# Patient Record
Sex: Female | Born: 1950
Health system: Southern US, Community
[De-identification: ages and names within clinical notes are randomized; demographics above are authoritative.]

## PROBLEM LIST (undated history)

## (undated) DIAGNOSIS — E785 Hyperlipidemia, unspecified: Secondary | ICD-10-CM

## (undated) DIAGNOSIS — B029 Zoster without complications: Secondary | ICD-10-CM

## (undated) DIAGNOSIS — F419 Anxiety disorder, unspecified: Secondary | ICD-10-CM

## (undated) DIAGNOSIS — K219 Gastro-esophageal reflux disease without esophagitis: Secondary | ICD-10-CM

## (undated) DIAGNOSIS — I10 Essential (primary) hypertension: Secondary | ICD-10-CM

## (undated) HISTORY — DX: Gastro-esophageal reflux disease without esophagitis: K21.9

## (undated) HISTORY — DX: Anxiety disorder, unspecified: F41.9

## (undated) HISTORY — DX: Hyperlipidemia, unspecified: E78.5

## (undated) HISTORY — PX: OTHER SURGICAL HISTORY: SHX169

## (undated) HISTORY — DX: Essential (primary) hypertension: I10

## (undated) HISTORY — PX: ABDOMINAL HYSTERECTOMY: SHX81

## (undated) HISTORY — DX: Zoster without complications: B02.9

---

## 2000-12-30 ENCOUNTER — Inpatient Hospital Stay (HOSPITAL_COMMUNITY): Admission: EM | Admit: 2000-12-30 | Discharge: 2001-01-02 | Payer: Self-pay | Admitting: Internal Medicine

## 2001-04-24 ENCOUNTER — Ambulatory Visit (HOSPITAL_BASED_OUTPATIENT_CLINIC_OR_DEPARTMENT_OTHER): Admission: RE | Admit: 2001-04-24 | Discharge: 2001-04-24 | Payer: Self-pay | Admitting: Orthopedic Surgery

## 2004-08-27 ENCOUNTER — Encounter: Admission: RE | Admit: 2004-08-27 | Discharge: 2004-08-27 | Payer: Self-pay | Admitting: *Deleted

## 2006-05-02 LAB — HM COLONOSCOPY

## 2012-10-25 LAB — LIPID PANEL: HDL: 52 mg/dL (ref 35–70)

## 2012-10-25 LAB — HEPATIC FUNCTION PANEL
ALT: 21 U/L (ref 7–35)
AST: 14 U/L (ref 13–35)
Bilirubin, Total: 0.5 mg/dL

## 2012-10-25 LAB — BASIC METABOLIC PANEL: Potassium: 4 mmol/L (ref 3.4–5.3)

## 2013-02-25 ENCOUNTER — Other Ambulatory Visit: Payer: Self-pay | Admitting: Family Medicine

## 2013-02-25 DIAGNOSIS — Z780 Asymptomatic menopausal state: Secondary | ICD-10-CM

## 2013-02-27 ENCOUNTER — Encounter: Payer: Self-pay | Admitting: *Deleted

## 2013-03-05 ENCOUNTER — Other Ambulatory Visit: Payer: Self-pay

## 2013-03-05 ENCOUNTER — Ambulatory Visit: Payer: 59

## 2013-03-05 ENCOUNTER — Ambulatory Visit (INDEPENDENT_AMBULATORY_CARE_PROVIDER_SITE_OTHER): Payer: 59 | Admitting: Pharmacist

## 2013-03-05 VITALS — BP 144/80 | HR 70 | Ht 65.5 in | Wt 159.5 lb

## 2013-03-05 DIAGNOSIS — E785 Hyperlipidemia, unspecified: Secondary | ICD-10-CM

## 2013-03-05 DIAGNOSIS — Z780 Asymptomatic menopausal state: Secondary | ICD-10-CM

## 2013-03-05 DIAGNOSIS — Z139 Encounter for screening, unspecified: Secondary | ICD-10-CM

## 2013-03-05 DIAGNOSIS — I10 Essential (primary) hypertension: Secondary | ICD-10-CM

## 2013-03-05 LAB — COMPLETE METABOLIC PANEL WITH GFR
ALT: 17 U/L (ref 0–35)
BUN: 18 mg/dL (ref 6–23)
CO2: 27 mEq/L (ref 19–32)
Chloride: 106 mEq/L (ref 96–112)
Creat: 0.83 mg/dL (ref 0.50–1.10)
GFR, Est African American: 87 mL/min
GFR, Est Non African American: 75 mL/min
Total Bilirubin: 0.5 mg/dL (ref 0.3–1.2)
Total Protein: 6.8 g/dL (ref 6.0–8.3)

## 2013-03-05 LAB — POCT CBC
Granulocyte percent: 59.5 %G (ref 37–80)
HCT, POC: 40.8 % (ref 37.7–47.9)
Lymph, poc: 2.7 (ref 0.6–3.4)
RBC: 4.8 M/uL (ref 4.04–5.48)
RDW, POC: 14.4 %

## 2013-03-05 NOTE — Progress Notes (Signed)
°  [facility] Current Height: Height: 5' 5.5" (166.4 cm)      Max Lifetime Height:  5' 5.5" Current Weight: Weight: 159 lb 8 oz (72.349 kg)    Ethnicity: Not Hispanic or Latino [1] BP:       HR:         HPI: Does pt already have a diagnosis of:  Osteopenia?  No Osteoporosis?  No  Back Pain?  No       Kyphosis?  No Prior fracture?  Yes , ankle in 2011 Med(s) for Osteoporosis/Osteopenia: none (suppose to take calcium but has been non compliant) Med(s) previously tried for Osteoporosis/Osteopenia: none                                                             PMH: Age at menopause:  41 Hysterectomy?  Yes Oophorectomy?  No HRT? Yes - Former.  Type/duration: estratest Steroid Use?  No Thyroid med?  No History of cancer?  No History of digestive disorders (ie Crohn's)?  Yes, GERD. Taking Protonix on a regular basis.   Current or previous eating disorders?  No Last Vitamin D Result:  44 (05/29/2011) Last GFR Result:  81 (10/25/2012)   FH/SH: Family history of osteoporosis?  Yes mother Parent with history of hip fracture?  No Family history of breast cancer?  No Exercise?  Yes , walking 4 to 5 days per week Caffeine?  No Smoking?  No Alcohol?  No    Calcium Assessment Calcium Intake  # of servings/day  Calcium mg  Milk (8 oz) 0.5 to 1  x  300  = 150 - 300mg   Yogurt (8 oz) 0.5 x  400 = 200mg   Cheese (1 oz) 0 x  200 = 0  Non dairy sources   250 mg  Ca supplement 0 = 0   Estimated calcium intake per day 600 to 750mg     DEXA Results Date of Test T-Score for AP Spine L1-L4 T-Score forTotal Left Hip T-Score for Total  Right Hip  03/05/2013 2.7 -0.3 0.3  08/03/2010 2.8 -0.1 0.1  10/30/2005 3.3 0.6 0.8         Assessment: Normal BMD Hyperlipidemia Hypertension  Recommendations: Recommend increase calcium intake to 1200mg  daily -pt preferrs through diet.  Discussed ways to increase calcium through dietary sources. Continue weight bearing exercise at least 4  days per week for 30 minutes Educated on fall risks and prevention Recheck DEXA:  3 - 5 years  Ordered labs below per patients PCP request.  Appointment made to F/U with PCP to discuss labs.  Orders Placed This Encounter  Procedures   NMR Lipoprofile with Lipids   Vitamin D 1,25 dihydroxy   COMPLETE METABOLIC PANEL WITH GFR   POCT CBC     Time spent counseling patient: 

## 2013-03-09 LAB — VITAMIN D 1,25 DIHYDROXY
Vitamin D 1, 25 (OH)2 Total: 65 pg/mL (ref 18–72)
Vitamin D2 1, 25 (OH)2: <8 pg/mL

## 2013-03-10 ENCOUNTER — Telehealth: Payer: Self-pay | Admitting: Family Medicine

## 2013-03-10 LAB — NMR LIPOPROFILE WITH LIPIDS
HDL Particle Number: 29.5 umol/L — ABNORMAL LOW (ref >=30.5)
HDL-C: 45 mg/dL (ref >=40)
LDL (calc): 72 mg/dL (ref <100)
LP-IR Score: 56 — ABNORMAL HIGH (ref <=45)
Large VLDL-P: 3.7 nmol/L — ABNORMAL HIGH (ref <=2.7)
Triglycerides: 118 mg/dL (ref <150)
VLDL Size: 46.5 nm (ref >=46.6)

## 2013-03-10 NOTE — Telephone Encounter (Signed)
Patient aware of labs.  

## 2013-03-31 ENCOUNTER — Other Ambulatory Visit: Payer: Self-pay

## 2013-03-31 MED ORDER — ATORVASTATIN CALCIUM 40 MG PO TABS: ORAL_TABLET | ORAL | Status: DC

## 2013-04-08 ENCOUNTER — Other Ambulatory Visit: Payer: Self-pay | Admitting: *Deleted

## 2013-04-08 MED ORDER — PANTOPRAZOLE SODIUM 40 MG PO TBEC: 40.0000 mg | DELAYED_RELEASE_TABLET | Freq: Every day | ORAL | Status: DC

## 2013-04-25 ENCOUNTER — Encounter: Payer: Self-pay | Admitting: Family Medicine

## 2013-04-25 ENCOUNTER — Ambulatory Visit (INDEPENDENT_AMBULATORY_CARE_PROVIDER_SITE_OTHER): Payer: 59 | Admitting: Family Medicine

## 2013-04-25 VITALS — BP 151/80 | HR 69 | Temp 97.4°F | Wt 164.4 lb

## 2013-04-25 DIAGNOSIS — K219 Gastro-esophageal reflux disease without esophagitis: Secondary | ICD-10-CM

## 2013-04-25 DIAGNOSIS — E785 Hyperlipidemia, unspecified: Secondary | ICD-10-CM

## 2013-04-25 DIAGNOSIS — I10 Essential (primary) hypertension: Secondary | ICD-10-CM

## 2013-04-25 MED ORDER — ATORVASTATIN CALCIUM 40 MG PO TABS: ORAL_TABLET | ORAL | Status: DC

## 2013-04-25 MED ORDER — LISINOPRIL 20 MG PO TABS: 20.0000 mg | ORAL_TABLET | Freq: Every day | ORAL | Status: DC

## 2013-04-25 MED ORDER — PANTOPRAZOLE SODIUM 40 MG PO TBEC: 40.0000 mg | DELAYED_RELEASE_TABLET | Freq: Every day | ORAL | Status: DC

## 2013-04-25 NOTE — Progress Notes (Signed)
Patient ID: Teresa Patel, female   DOB: [birth year], 62 y.o.   MRN: [id number] SUBJECTIVE: HPI: Patient is here for follow up of hyperlipidemia/GERD/Hypertension.  : denies Headache;denies Chest Pain;denies weakness;denies Shortness of Breath and orthopnea;denies Visual changes;denies palpitations;denies cough;denies pedal edema;denies symptoms of TIA or stroke;deniesClaudication symptoms. admits to Compliance with medications; denies Problems with medications. Feels well    PMH/PSH: reviewed/updated in Epic  SH/FH: reviewed/updated in Epic  Allergies: reviewed/updated in Epic  Medications: reviewed/updated in Epic  Immunizations: reviewed/updated in Epic  ROS: As above in the HPI. All other systems are stable or negative.  OBJECTIVE: APPEARANCE:  Patient in no acute distress.The patient appeared well nourished and normally developed. Acyanotic. Waist: VITAL SIGNS:BP 151/80  Pulse 69  Temp(Src) 97.4 F (36.3 C) (Oral)  Wt 164 lb 6.4 oz (74.571 kg)  BMI 26.93 kg/m2 WF looks very well  SKIN: warm and  Dry without overt rashes, tattoos and scars  HEAD and Neck: without JVD, Head and scalp: normal Eyes:No scleral icterus. Fundi normal, eye movements normal. Ears: Auricle normal, canal normal, Tympanic membranes normal, insufflation normal. Nose: normal Throat: normal Neck & thyroid: normal  CHEST & LUNGS: Chest wall: normal Lungs: Clear  CVS: Reveals the PMI to be normally located. Regular rhythm, First and Second Heart sounds are normal,  absence of murmurs, rubs or gallops. Peripheral vasculature: Radial pulses: normal Dorsal pedis pulses: normal Posterior pulses: normal  ABDOMEN:  Appearance: normal Benign,, no organomegaly, no masses, no Abdominal Aortic enlargement. No Guarding , no rebound. No Bruits. Bowel sounds: normal  RECTAL: N/A GU: N/A  EXTREMETIES: nonedematous. Both Femoral and Pedal pulses are normal.  MUSCULOSKELETAL:  Spine:  normal Joints: intact  NEUROLOGIC: oriented to time,place and person; nonfocal. Strength is normal Sensory is normal Reflexes are normal Cranial Nerves are normal.  LABS:  Results for orders placed in visit on 03/05/13  NMR LIPOPROFILE WITH LIPIDS      Result Value Range   LDL Particle Number 780  <1000 nmol/L   LDL (calc) 72  <100 mg/dL   HDL-C 45  >=40 mg/dL   Triglycerides 981  <191 mg/dL   Cholesterol, Total 478  <200 mg/dL   HDL Particle Number 29.5 (*) >=30.5 umol/L   Large HDL-P 2.9 (*) >=4.8 umol/L   Large VLDL-P 3.7 (*) <=2.7 nmol/L   Small LDL Particle Number 433  <=527 nmol/L   LDL Size 20.7  >20.5 nm   HDL Size 9.1 (*) >=9.2 nm   VLDL Size 46.5  >=46.6 nm   LP-IR Score 56 (*) <=45  VITAMIN D 1,25 DIHYDROXY      Result Value Range   Vitamin D 1, 25 (OH) Total 65  18 - 72 pg/mL   Vitamin D3 1, 25 (OH) 65     Vitamin D2 1, 25 (OH) <8    COMPLETE METABOLIC PANEL WITH GFR      Result Value Range   Sodium 140  135 - 145 mEq/L   Potassium 4.3  3.5 - 5.3 mEq/L   Chloride 106  96 - 112 mEq/L   CO2 27  19 - 32 mEq/L   Glucose, Bld 118 (*) 70 - 99 mg/dL   BUN 18  6 - 23 mg/dL   Creat 6.21  3.08 - 6.57 mg/dL   Total Bilirubin 0.5  0.3 - 1.2 mg/dL   Alkaline Phosphatase 78  39 - 117 U/L   AST 18  0 - 37 U/L   ALT 17  0 -  35 U/L   Total Protein 6.8  6.0 - 8.3 g/dL   Albumin 4.2  3.5 - 5.2 g/dL   Calcium 16.1  8.4 - 09.6 mg/dL   GFR, Est African American 87     GFR, Est Non African American 75    POCT CBC      Result Value Range   WBC 8.0  4.6 - 10.2 K/uL   Lymph, poc 2.7  0.6 - 3.4   POC LYMPH PERCENT 33.9  10 - 50 %L   MID (cbc)    0 - 0.9   POC MID %    0 - 12 %M   POC Granulocyte 4.8  2 - 6.9   Granulocyte percent 59.5  37 - 80 %G   RBC 4.8  4.04 - 5.48 M/uL   Hemoglobin 13.4  12.2 - 16.2 g/dL   HCT, POC 04.5  40.9 - 47.9 %   MCV 84.7  80 - 97 fL   MCH, POC 27.8  27 - 31.2 pg   MCHC 32.8  31.8 - 35.4 g/dL   RDW, POC 81.1     Platelet Count, POC 283.0   142 - 424 K/uL   MPV 8.6  0 - 99.8 fL    ASSESSMENT: HTN (hypertension) - Plan: lisinopril (PRINIVIL,ZESTRIL) 20 MG tablet, BASIC METABOLIC PANEL WITH GFR, CANCELED: BASIC METABOLIC PANEL WITH GFR  HLD (hyperlipidemia) - Plan: atorvastatin (LIPITOR) 40 MG tablet, Hepatic function panel, NMR Lipoprofile with Lipids, CANCELED: Hepatic function panel, CANCELED: NMR Lipoprofile with Lipids  GERD (gastroesophageal reflux disease) - Plan: pantoprazole (PROTONIX) 40 MG tablet    PLAN: Reviewed labs with patient. Discussed wellness and lifestyle.      Dr Woodroe Mode Recommendations  Diet and Exercise discussed with patient.  For nutrition information, I recommend books:  1).Eat to Live by Dr Monico Hoar. 2).Prevent and Reverse Heart Disease by Dr Suzzette Righter.  Exercise recommendations are:  If unable to walk, then the patient can exercise in a chair 3 times a day. By flapping arms like a bird gently and raising legs outwards to the front.  If ambulatory, the patient can go for walks for 30 minutes 3 times a week. Then increase the intensity and duration as tolerated.  Goal is to try to attain exercise frequency to 5 times a week.  If applicable: Best to perform resistance exercises (machines or weights) 2 days a week and cardio type exercises 3 days per week. Meds ordered this encounter  Medications  . pantoprazole (PROTONIX) 40 MG tablet    Sig: Take 1 tablet (40 mg total) by mouth daily.    Dispense:  90 tablet    Refill:  0  . atorvastatin (LIPITOR) 40 MG tablet    Sig: One  daily    Dispense:  30 tablet    Refill:  0  . lisinopril (PRINIVIL,ZESTRIL) 20 MG tablet    Sig: Take 1 tablet (20 mg total) by mouth daily. Takes half daily    Dispense:  30 tablet    Refill:  11   Orders Placed This Encounter  Procedures  . BASIC METABOLIC PANEL WITH GFR    Standing Status: Future     Number of Occurrences:      Standing Expiration Date: 04/25/2014  . Hepatic  function panel    Standing Status: Future     Number of Occurrences:      Standing Expiration Date: 04/25/2014  . NMR Lipoprofile with Lipids  Standing Status: Future     Number of Occurrences:      Standing Expiration Date: 04/25/2014  GERD instructions and counselling discussed.  RTC 4 months.  Rodarius Kichline P. Modesto Charon, M.D.

## 2013-04-25 NOTE — Patient Instructions (Addendum)
Gastroesophageal Reflux Disease, Adult Gastroesophageal reflux disease (GERD) happens when acid from your stomach flows up into the esophagus. When acid comes in contact with the esophagus, the acid causes soreness (inflammation) in the esophagus. Over time, GERD may create small holes (ulcers) in the lining of the esophagus. CAUSES   Increased body weight. This puts pressure on the stomach, making acid rise from the stomach into the esophagus.  Smoking. This increases acid production in the stomach.  Drinking alcohol. This causes decreased pressure in the lower esophageal sphincter (valve or ring of muscle between the esophagus and stomach), allowing acid from the stomach into the esophagus.  Late evening meals and a full stomach. This increases pressure and acid production in the stomach.  A malformed lower esophageal sphincter. Sometimes, no cause is found. SYMPTOMS   Burning pain in the lower part of the mid-chest behind the breastbone and in the mid-stomach area. This may occur twice a week or more often.  Trouble swallowing.  Sore throat.  Dry cough.  Asthma-like symptoms including chest tightness, shortness of breath, or wheezing. DIAGNOSIS  Your caregiver may be able to diagnose GERD based on your symptoms. In some cases, X-rays and other tests may be done to check for complications or to check the condition of your stomach and esophagus. TREATMENT  Your caregiver may recommend over-the-counter or prescription medicines to help decrease acid production. Ask your caregiver before starting or adding any new medicines.  HOME CARE INSTRUCTIONS   Change the factors that you can control. Ask your caregiver for guidance concerning weight loss, quitting smoking, and alcohol consumption.  Avoid foods and drinks that make your symptoms worse, such as:  Caffeine or alcoholic drinks.  Chocolate.  Peppermint or mint flavorings.  Garlic and onions.  Spicy foods.  Citrus fruits,  such as oranges, lemons, or limes.  Tomato-based foods such as sauce, chili, salsa, and pizza.  Fried and fatty foods.  Avoid lying down for the 3 hours prior to your bedtime or prior to taking a nap.  Eat small, frequent meals instead of large meals.  Wear loose-fitting clothing. Do not wear anything tight around your waist that causes pressure on your stomach.  Raise the head of your bed 6 to 8 inches with wood blocks to help you sleep. Extra pillows will not help.  Only take over-the-counter or prescription medicines for pain, discomfort, or fever as directed by your caregiver.  Do not take aspirin, ibuprofen, or other nonsteroidal anti-inflammatory drugs (NSAIDs). SEEK IMMEDIATE MEDICAL CARE IF:   You have pain in your arms, neck, jaw, teeth, or back.  Your pain increases or changes in intensity or duration.  You develop nausea, vomiting, or sweating (diaphoresis).  You develop shortness of breath, or you faint.  Your vomit is green, yellow, black, or looks like coffee grounds or blood.  Your stool is red, bloody, or black. These symptoms could be signs of other problems, such as heart disease, gastric bleeding, or esophageal bleeding. MAKE SURE YOU:   Understand these instructions.  Will watch your condition.  Will get help right away if you are not doing well or get worse. Document Released: 09/13/2005 Document Revised: 02/26/2012 Document Reviewed: 06/23/2011 [facility] Patient Information 2013 [place], [place].        Dr Woodroe Mode Recommendations  Diet and Exercise discussed with patient.  For nutrition information, I recommend books:  1).Eat to Live by Dr Monico Hoar. 2).Prevent and Reverse Heart Disease by Dr Suzzette Righter.  Exercise recommendations  are:  If unable to walk, then the patient can exercise in a chair 3 times a day. By flapping arms like a bird gently and raising legs outwards to the front.  If ambulatory, the patient can go for  walks for 30 minutes 3 times a week. Then increase the intensity and duration as tolerated.  Goal is to try to attain exercise frequency to 5 times a week.  If applicable: Best to perform resistance exercises (machines or weights) 2 days a week and cardio type exercises 3 days per week.

## 2013-07-09 ENCOUNTER — Other Ambulatory Visit: Payer: Self-pay

## 2013-07-09 DIAGNOSIS — E785 Hyperlipidemia, unspecified: Secondary | ICD-10-CM

## 2013-07-09 MED ORDER — ATORVASTATIN CALCIUM 40 MG PO TABS: ORAL_TABLET | ORAL | Status: DC

## 2013-08-20 ENCOUNTER — Other Ambulatory Visit (INDEPENDENT_AMBULATORY_CARE_PROVIDER_SITE_OTHER): Payer: 59

## 2013-08-20 DIAGNOSIS — I10 Essential (primary) hypertension: Secondary | ICD-10-CM

## 2013-08-20 DIAGNOSIS — E785 Hyperlipidemia, unspecified: Secondary | ICD-10-CM

## 2013-08-20 LAB — BASIC METABOLIC PANEL WITH GFR
BUN: 19 mg/dL (ref 6–23)
CO2: 26 mEq/L (ref 19–32)
Calcium: 9.6 mg/dL (ref 8.4–10.5)
Chloride: 108 mEq/L (ref 96–112)
Creat: 0.75 mg/dL (ref 0.50–1.10)
GFR, Est African American: >89 mL/min
GFR, Est Non African American: 85 mL/min
Glucose, Bld: 86 mg/dL (ref 70–99)
Potassium: 3.9 mEq/L (ref 3.5–5.3)
Sodium: 141 mEq/L (ref 135–145)

## 2013-08-20 LAB — HEPATIC FUNCTION PANEL
ALT: 16 U/L (ref 0–35)
AST: 20 U/L (ref 0–37)
Albumin: 4.2 g/dL (ref 3.5–5.2)
Alkaline Phosphatase: 68 U/L (ref 39–117)
Bilirubin, Direct: 0.1 mg/dL (ref 0.0–0.3)
Indirect Bilirubin: 0.6 mg/dL (ref 0.0–0.9)
Total Bilirubin: 0.7 mg/dL (ref 0.3–1.2)
Total Protein: 6.8 g/dL (ref 6.0–8.3)

## 2013-08-20 NOTE — Progress Notes (Signed)
Patient came in for labs only.

## 2013-08-21 LAB — NMR LIPOPROFILE WITH LIPIDS
Cholesterol, Total: 169 mg/dL (ref <200)
HDL Particle Number: 32.2 umol/L (ref >=30.5)
HDL Size: 8.6 nm — ABNORMAL LOW (ref >=9.2)
HDL-C: 43 mg/dL (ref >=40)
LDL (calc): 100 mg/dL — ABNORMAL HIGH (ref <100)
LDL Particle Number: 1309 nmol/L — ABNORMAL HIGH (ref <1000)
LDL Size: 20.7 nm (ref >20.5)
LP-IR Score: 64 — ABNORMAL HIGH (ref <=45)
Large HDL-P: 1.9 umol/L — ABNORMAL LOW (ref >=4.8)
Large VLDL-P: 2.4 nmol/L (ref <=2.7)
Small LDL Particle Number: 678 nmol/L — ABNORMAL HIGH (ref <=527)
Triglycerides: 131 mg/dL (ref <150)
VLDL Size: 44.5 nm (ref <=46.6)

## 2013-08-26 ENCOUNTER — Ambulatory Visit (INDEPENDENT_AMBULATORY_CARE_PROVIDER_SITE_OTHER): Payer: 59 | Admitting: Family Medicine

## 2013-08-26 ENCOUNTER — Encounter: Payer: Self-pay | Admitting: Family Medicine

## 2013-08-26 ENCOUNTER — Ambulatory Visit (INDEPENDENT_AMBULATORY_CARE_PROVIDER_SITE_OTHER): Payer: 59

## 2013-08-26 VITALS — BP 133/85 | HR 71 | Temp 98.0°F | Wt 159.8 lb

## 2013-08-26 DIAGNOSIS — I10 Essential (primary) hypertension: Secondary | ICD-10-CM

## 2013-08-26 DIAGNOSIS — F419 Anxiety disorder, unspecified: Secondary | ICD-10-CM | POA: Insufficient documentation

## 2013-08-26 DIAGNOSIS — E785 Hyperlipidemia, unspecified: Secondary | ICD-10-CM | POA: Insufficient documentation

## 2013-08-26 DIAGNOSIS — G8929 Other chronic pain: Secondary | ICD-10-CM

## 2013-08-26 DIAGNOSIS — K219 Gastro-esophageal reflux disease without esophagitis: Secondary | ICD-10-CM | POA: Insufficient documentation

## 2013-08-26 DIAGNOSIS — M25559 Pain in unspecified hip: Secondary | ICD-10-CM

## 2013-08-26 DIAGNOSIS — F411 Generalized anxiety disorder: Secondary | ICD-10-CM

## 2013-08-26 NOTE — Progress Notes (Signed)
Patient ID: Teresa Patel, female   DOB: 02/22/1951, 62 y.o.   MRN: [id number] SUBJECTIVE: CC: Chief Complaint  Patient presents with  . Follow-up    4 month follow up   discuss labs    HPI: Saw Dr Ottis Stain on [facility]. Difficult for her to make change in Diet. Doing great.GERD is controlled with standard. Right hip hurts. Walks 5 days a week wlaks 2 miles. Hurts when she lays on it. Has some arthritis and some bulging disc. Can take 1 Ibuprofen and it goes away.  Past Medical History  Diagnosis Date  . Hyperlipidemia   . Hypertension   . GERD (gastroesophageal reflux disease)   . Anxiety    Past Surgical History  Procedure Laterality Date  . Abdominal hysterectomy     History   Social History  . Marital Status: Married    Spouse Name: N/A    Number of Children: N/A  . Years of Education: N/A   Occupational History  . Not on file.   Social History Main Topics  . Smoking status: Never Smoker   . Smokeless tobacco: Not on file  . Alcohol Use: No  . Drug Use: No  . Sexual Activity: Not on file   Other Topics Concern  . Not on file   Social History Narrative  . No narrative on file   Family History  Problem Relation Age of Onset  . Heart disease Mother   . Diabetes Father   . Heart disease Father   . Cancer Brother   . Diabetes Brother   . Heart disease Brother   . Heart disease Brother    Current Outpatient Prescriptions on File Prior to Visit  Medication Sig Dispense Refill  . aspirin 81 MG tablet Take 81 mg by mouth daily.      Marland Kitchen atorvastatin (LIPITOR) 40 MG tablet One  daily  30 tablet  3  . calcium carbonate 200 MG capsule Take 250 mg by mouth 2 (two) times daily with a meal.      . lisinopril (PRINIVIL,ZESTRIL) 20 MG tablet Take 1 tablet (20 mg total) by mouth daily. Takes half daily  30 tablet  11  . Nutritional Supplements (GRAPESEED EXTRACT PO) Take by mouth.      . Omega-3 Fatty Acids (FISH OIL PO) Take by mouth.       No current  facility-administered medications on file prior to visit.   No Known Allergies Immunization History  Administered Date(s) Administered  . Influenza Whole 10/25/2012   Prior to Admission medications   Medication Sig Start Date End Date Taking? Authorizing Provider  aspirin 81 MG tablet Take 81 mg by mouth daily.   Yes Historical Provider, MD  atorvastatin (LIPITOR) 40 MG tablet One  daily 07/09/13  Yes Ernestina Penna, MD  calcium carbonate 200 MG capsule Take 250 mg by mouth 2 (two) times daily with a meal.   Yes Historical Provider, MD  lisinopril (PRINIVIL,ZESTRIL) 20 MG tablet Take 1 tablet (20 mg total) by mouth daily. Takes half daily 04/25/13  Yes Ileana Ladd, MD  Nutritional Supplements (GRAPESEED EXTRACT PO) Take by mouth.   Yes Historical Provider, MD  Omega-3 Fatty Acids (FISH OIL PO) Take by mouth.   Yes Historical Provider, MD    ROS: As above in the HPI. All other systems are stable or negative.  OBJECTIVE: APPEARANCE:  Patient in no acute distress.The patient appeared well nourished and normally developed. Acyanotic. Waist: VITAL SIGNS:BP 133/85  Pulse 71  Temp(Src) 98 F (36.7 C) (Oral)  Wt 159 lb 12.8 oz (72.485 kg)  BMI 26.18 kg/m2 WF  SKIN: warm and  Dry without overt rashes, tattoos and scars  HEAD and Neck: without JVD, Head and scalp: normal Eyes:No scleral icterus. Fundi normal, eye movements normal. Ears: Auricle normal, canal normal, Tympanic membranes normal, insufflation normal. Nose: normal Throat: normal Neck & thyroid: normal  CHEST & LUNGS: Chest wall: normal Lungs: Clear  CVS: Reveals the PMI to be normally located. Regular rhythm, First and Second Heart sounds are normal,  absence of murmurs, rubs or gallops. Peripheral vasculature: Radial pulses: normal Dorsal pedis pulses: normal Posterior pulses: normal  ABDOMEN:  Appearance: normal Benign, no organomegaly, no masses, no Abdominal Aortic enlargement. No Guarding , no  rebound. No Bruits. Bowel sounds: normal  RECTAL: N/A GU: N/A  EXTREMETIES: nonedematous.  MUSCULOSKELETAL:  Spine: normal Joints: right hip has FROM but minimal discomfort  NEUROLOGIC: oriented to time,place and person; nonfocal. Strength is normal Sensory is normal Reflexes are normal Cranial Nerves are normal.  Results for orders placed in visit on 08/20/13  BASIC METABOLIC PANEL WITH GFR      Result Value Range   Sodium 141  135 - 145 mEq/L   Potassium 3.9  3.5 - 5.3 mEq/L   Chloride 108  96 - 112 mEq/L   CO2 26  19 - 32 mEq/L   Glucose, Bld 86  70 - 99 mg/dL   BUN 19  6 - 23 mg/dL   Creat 7.82  9.56 - 2.13 mg/dL   Calcium 9.6  8.4 - 08.6 mg/dL   GFR, Est African American >89     GFR, Est Non African American 85    HEPATIC FUNCTION PANEL      Result Value Range   Total Bilirubin 0.7  0.3 - 1.2 mg/dL   Bilirubin, Direct 0.1  0.0 - 0.3 mg/dL   Indirect Bilirubin 0.6  0.0 - 0.9 mg/dL   Alkaline Phosphatase 68  39 - 117 U/L   AST 20  0 - 37 U/L   ALT 16  0 - 35 U/L   Total Protein 6.8  6.0 - 8.3 g/dL   Albumin 4.2  3.5 - 5.2 g/dL  NMR LIPOPROFILE WITH LIPIDS      Result Value Range   LDL Particle Number 1309 (*) <1000 nmol/L   LDL (calc) 100 (*) <100 mg/dL   HDL-C 43  >=57 mg/dL   Triglycerides 846  <962 mg/dL   Cholesterol, Total 952  <200 mg/dL   HDL Particle Number 84.1  >=32.4 umol/L   Large HDL-P 1.9 (*) >=4.8 umol/L   Large VLDL-P 2.4  <=2.7 nmol/L   Small LDL Particle Number 678 (*) <=527 nmol/L   LDL Size 20.7  >20.5 nm   HDL Size 8.6 (*) >=9.2 nm   VLDL Size 44.5  <=46.6 nm   LP-IR Score 64 (*) <=45    ASSESSMENT: Anxiety  GERD (gastroesophageal reflux disease)  Hyperlipidemia  Hypertension  Hip pain, chronic, right - Plan: DG Hip Complete Right   PLAN: Orders Placed This Encounter  Procedures  . DG Hip Complete Right    Standing Status: Future     Number of Occurrences: 1     Standing Expiration Date: 10/26/2014    Order Specific  Question:  Reason for Exam (SYMPTOM  OR DIAGNOSIS REQUIRED)    Answer:  right hip pain    Order Specific Question:  Preferred imaging  location?    Answer:  Internal    [facility] reading (PRIMARY) by  Dr. Modesto Charon: minimal degenerative changes.  With her recurring hip pain due to developing arthritis, we discussed varying her exercise program other than walking.this should include resistance exercises and swimming/water exercises.      An occasional aleve or ibuprofen for the hip pain is okay. We discussed Dr Rosana Hoes program: Eat to Live. Return in about 3 months (around 11/25/2013) for Recheck medical problems.  Ahmoni Edge P. [place] Charon, M.D.

## 2013-11-21 ENCOUNTER — Other Ambulatory Visit (INDEPENDENT_AMBULATORY_CARE_PROVIDER_SITE_OTHER): Payer: BC Managed Care – PPO

## 2013-11-21 ENCOUNTER — Encounter (INDEPENDENT_AMBULATORY_CARE_PROVIDER_SITE_OTHER): Payer: Self-pay

## 2013-11-21 ENCOUNTER — Other Ambulatory Visit: Payer: Self-pay | Admitting: *Deleted

## 2013-11-21 DIAGNOSIS — E785 Hyperlipidemia, unspecified: Secondary | ICD-10-CM

## 2013-11-21 DIAGNOSIS — I10 Essential (primary) hypertension: Secondary | ICD-10-CM

## 2013-11-21 MED ORDER — ATORVASTATIN CALCIUM 40 MG PO TABS: ORAL_TABLET | ORAL | Status: DC

## 2013-11-21 NOTE — Progress Notes (Signed)
Pt came in for labs only 

## 2013-11-23 LAB — HEPATIC FUNCTION PANEL
ALT: 20 IU/L (ref 0–32)
AST: 20 IU/L (ref 0–40)
Albumin: 4.2 g/dL (ref 3.6–4.8)
Alkaline Phosphatase: 66 IU/L (ref 39–117)
Bilirubin, Direct: 0.14 mg/dL (ref 0.00–0.40)
Total Bilirubin: 0.5 mg/dL (ref 0.0–1.2)
Total Protein: 6.7 g/dL (ref 6.0–8.5)

## 2013-11-23 LAB — BMP8+EGFR
BUN/Creatinine Ratio: 21 (ref 11–26)
BUN: 17 mg/dL (ref 8–27)
CO2: 24 mmol/L (ref 18–29)
Calcium: 9.7 mg/dL (ref 8.6–10.2)
Chloride: 103 mmol/L (ref 97–108)
Creatinine, Ser: 0.8 mg/dL (ref 0.57–1.00)
GFR calc Af Amer: 90 mL/min/{1.73_m2} (ref >59)
GFR calc non Af Amer: 78 mL/min/{1.73_m2} (ref >59)
Glucose: 94 mg/dL (ref 65–99)
Potassium: 4.3 mmol/L (ref 3.5–5.2)
Sodium: 143 mmol/L (ref 134–144)

## 2013-11-23 LAB — NMR, LIPOPROFILE
Cholesterol: 158 mg/dL (ref <200)
HDL Cholesterol by NMR: 49 mg/dL (ref >=40)
HDL Particle Number: 32.9 umol/L (ref >=30.5)
LDL Particle Number: 938 nmol/L (ref <1000)
LDL Size: 21.1 nm (ref >20.5)
LDLC SERPL CALC-MCNC: 77 mg/dL (ref <100)
LP-IR Score: 41 (ref <=45)
Small LDL Particle Number: 414 nmol/L (ref <=527)
Triglycerides by NMR: 159 mg/dL — ABNORMAL HIGH (ref <150)

## 2013-11-28 ENCOUNTER — Encounter: Payer: Self-pay | Admitting: Family Medicine

## 2013-11-28 ENCOUNTER — Ambulatory Visit (INDEPENDENT_AMBULATORY_CARE_PROVIDER_SITE_OTHER): Payer: BC Managed Care – PPO | Admitting: Family Medicine

## 2013-11-28 VITALS — BP 130/73 | HR 71 | Temp 97.5°F | Ht 65.5 in | Wt 159.0 lb

## 2013-11-28 DIAGNOSIS — I10 Essential (primary) hypertension: Secondary | ICD-10-CM | POA: Insufficient documentation

## 2013-11-28 DIAGNOSIS — K219 Gastro-esophageal reflux disease without esophagitis: Secondary | ICD-10-CM

## 2013-11-28 DIAGNOSIS — Z23 Encounter for immunization: Secondary | ICD-10-CM | POA: Insufficient documentation

## 2013-11-28 DIAGNOSIS — E785 Hyperlipidemia, unspecified: Secondary | ICD-10-CM

## 2013-11-28 MED ORDER — LISINOPRIL 20 MG PO TABS: 20.0000 mg | ORAL_TABLET | Freq: Every day | ORAL | Status: DC

## 2013-11-28 NOTE — Progress Notes (Signed)
Patient ID: Teresa Patel, female   DOB: 18-Apr-1951, 62 y.o.   MRN: [id number] SUBJECTIVE: CC: Chief Complaint  Patient presents with  . Follow-up    3 month f/u on chronic medical conditions    HPI:  Patient is here for follow up of hyperlipidemia/HTN/GERD/: denies Headache;denies Chest Pain;denies weakness;denies Shortness of Breath and orthopnea;denies Visual changes;denies palpitations;denies cough;denies pedal edema;denies symptoms of TIA or stroke;deniesClaudication symptoms. admits to Compliance with medications; denies Problems with medications. Doing well. No complaints  Past Medical History  Diagnosis Date  . Hyperlipidemia   . Hypertension   . GERD (gastroesophageal reflux disease)   . Anxiety    Past Surgical History  Procedure Laterality Date  . Abdominal hysterectomy     History   Social History  . Marital Status: Married    Spouse Name: N/A    Number of Children: N/A  . Years of Education: N/A   Occupational History  . Not on file.   Social History Main Topics  . Smoking status: Never Smoker   . Smokeless tobacco: Not on file  . Alcohol Use: No  . Drug Use: No  . Sexual Activity: Not on file   Other Topics Concern  . Not on file   Social History Narrative  . No narrative on file   Family History  Problem Relation Age of Onset  . Heart disease Mother   . Diabetes Father   . Heart disease Father   . Cancer Brother   . Diabetes Brother   . Heart disease Brother   . Heart disease Brother    Current Outpatient Prescriptions on File Prior to Visit  Medication Sig Dispense Refill  . aspirin 81 MG tablet Take 81 mg by mouth daily.      Marland Kitchen atorvastatin (LIPITOR) 40 MG tablet One  daily  30 tablet  5  . Nutritional Supplements (GRAPESEED EXTRACT PO) Take by mouth.      . Omega-3 Fatty Acids (FISH OIL PO) Take by mouth.       No current facility-administered medications on file prior to visit.   No Known Allergies Immunization History   Administered Date(s) Administered  . Influenza Whole 10/25/2012  . Influenza,inj,Quad PF,36+ Mos 11/28/2013   Prior to Admission medications   Medication Sig Start Date End Date Taking? Authorizing Provider  aspirin 81 MG tablet Take 81 mg by mouth daily.   Yes Historical Provider, MD  atorvastatin (LIPITOR) 40 MG tablet One  daily 11/21/13  Yes Ileana Ladd, MD  lisinopril (PRINIVIL,ZESTRIL) 20 MG tablet Take 1 tablet (20 mg total) by mouth daily. Takes half daily 04/25/13  Yes Ileana Ladd, MD  Nutritional Supplements (GRAPESEED EXTRACT PO) Take by mouth.   Yes Historical Provider, MD  Omega-3 Fatty Acids (FISH OIL PO) Take by mouth.   Yes Historical Provider, MD     ROS: As above in the HPI. All other systems are stable or negative.  OBJECTIVE: APPEARANCE:  Patient in no acute distress.The patient appeared well nourished and normally developed. Acyanotic. Waist: VITAL SIGNS:BP 130/73  Pulse 71  Temp(Src) 97.5 F (36.4 C) (Oral)  Ht 5' 5.5" (1.664 m)  Wt 159 lb (72.122 kg)  BMI 26.05 kg/m2  WF SKIN: warm and  Dry without overt rashes, tattoos and scars  HEAD and Neck: without JVD, Head and scalp: normal Eyes:No scleral icterus. Fundi normal, eye movements normal. Ears: Auricle normal, canal normal, Tympanic membranes normal, insufflation normal. Nose: normal Throat: normal Neck & thyroid:  normal  CHEST & LUNGS: Chest wall: normal Lungs: Clear  CVS: Reveals the PMI to be normally located. Regular rhythm, First and Second Heart sounds are normal,  absence of murmurs, rubs or gallops. Peripheral vasculature: Radial pulses: normal Dorsal pedis pulses: normal Posterior pulses: normal  ABDOMEN:  Appearance: normal Benign, no organomegaly, no masses, no Abdominal Aortic enlargement. No Guarding , no rebound. No Bruits. Bowel sounds: normal  RECTAL: N/A GU: N/A  EXTREMETIES: nonedematous.  MUSCULOSKELETAL:  Spine: normal Joints: intact  NEUROLOGIC:  oriented to time,place and person; nonfocal. Strength is normal Sensory is normal Reflexes are normal Cranial Nerves are normal.   Results for orders placed in visit on 11/21/13  BMP8+EGFR      Result Value Range   Glucose 94  65 - 99 mg/dL   BUN 17  8 - 27 mg/dL   Creatinine, Ser 8.84  0.57 - 1.00 mg/dL   GFR calc non Af Amer 78  >59 mL/min/1.73   GFR calc Af Amer 90  >59 mL/min/1.73   BUN/Creatinine Ratio 21  11 - 26   Sodium 143  134 - 144 mmol/L   Potassium 4.3  3.5 - 5.2 mmol/L   Chloride 103  97 - 108 mmol/L   CO2 24  18 - 29 mmol/L   Calcium 9.7  8.6 - 10.2 mg/dL  HEPATIC FUNCTION PANEL      Result Value Range   Total Protein 6.7  6.0 - 8.5 g/dL   Albumin 4.2  3.6 - 4.8 g/dL   Total Bilirubin 0.5  0.0 - 1.2 mg/dL   Bilirubin, Direct 1.66  0.00 - 0.40 mg/dL   Alkaline Phosphatase 66  39 - 117 IU/L   AST 20  0 - 40 IU/L   ALT 20  0 - 32 IU/L  NMR, LIPOPROFILE      Result Value Range   LDL Particle Number [id number]  <1000 nmol/L   LDLC SERPL CALC-MCNC 77  <100 mg/dL   HDL Cholesterol by NMR 49  >=40 mg/dL   Triglycerides by NMR 159 (*) <150 mg/dL   Cholesterol 063  <016 mg/dL   HDL Particle Number 01.0  >=93.2 umol/L   Small LDL Particle Number 414  <=527 nmol/L   LDL Size 21.1  >20.5 nm   LP-IR Score 41  <=45    ASSESSMENT: HTN (hypertension) - Plan: lisinopril (PRINIVIL,ZESTRIL) 20 MG tablet  Hyperlipidemia - Plan: CMP14+EGFR, NMR, lipoprofile  Need for prophylactic vaccination and inoculation against influenza  GERD (gastroesophageal reflux disease)  PLAN: Healthy diet and keep active. Exercise and balanced lifestyle. For both her and her husband.   Orders Placed This Encounter  Procedures  . CMP14+EGFR    Standing Status: Future     Number of Occurrences:      Standing Expiration Date: 11/28/2014    Order Specific Question:  Has the patient fasted?    Answer:  Yes  . NMR, lipoprofile    Standing Status: Future     Number of Occurrences:       Standing Expiration Date: 11/28/2014   Meds ordered this encounter  Medications  . lisinopril (PRINIVIL,ZESTRIL) 20 MG tablet    Sig: Take 1 tablet (20 mg total) by mouth daily. Takes half daily    Dispense:  30 tablet    Refill:  11   Medications Discontinued During This Encounter  Medication Reason  . calcium carbonate 200 MG capsule Change in therapy  . lisinopril (PRINIVIL,ZESTRIL) 20 MG tablet Reorder  Return in about 4 months (around 03/29/2014) for Recheck medical problems.  Alita Waldren P. Modesto Charon, M.D.

## 2013-11-28 NOTE — Patient Instructions (Signed)
Check with insurance company about coverage of shingles vaccine  Influenza Virus Vaccine injection What is this medicine? INFLUENZA VIRUS VACCINE (in floo EN zuh VAHY ruhs vak SEEN) helps to reduce the risk of getting influenza also known as the flu. The vaccine only helps protect you against some strains of the flu. This medicine may be used for other purposes; ask your health care provider or pharmacist if you have questions. COMMON BRAND NAME(S): Afluria , Agriflu, Fluarix Quadrivalent, Fluarix, FLUCELVAX, Flulaval, Fluvirin, Fluzone High-Dose, Fluzone Intradermal, Fluzone What should I tell my health care provider before I take this medicine? They need to know if you have any of these conditions: -bleeding disorder like hemophilia -fever or infection -Guillain-Barre syndrome or other neurological problems -immune system problems -infection with the human immunodeficiency virus (HIV) or AIDS -low blood platelet counts -multiple sclerosis -an unusual or allergic reaction to influenza virus vaccine, latex, other medicines, foods, dyes, or preservatives. Different brands of vaccines contain different allergens. Some may contain latex or eggs. Talk to your doctor about your allergies to make sure that you get the right vaccine. -pregnant or trying to get pregnant -breast-feeding How should I use this medicine? This vaccine is for injection into a muscle or under the skin. It is given by a health care professional. A copy of Vaccine Information Statements will be given before each vaccination. Read this sheet carefully each time. The sheet may change frequently. Talk to your healthcare provider to see which vaccines are right for you. Some vaccines should not be used in all age groups. Overdosage: If you think you have taken too much of this medicine contact a poison control center or emergency room at once. NOTE: This medicine is only for you. Do not share this medicine with others. What if I  miss a dose? This does not apply. What may interact with this medicine? -chemotherapy or radiation therapy -medicines that lower your immune system like etanercept, anakinra, infliximab, and adalimumab -medicines that treat or prevent blood clots like warfarin -phenytoin -steroid medicines like prednisone or cortisone -theophylline -vaccines This list may not describe all possible interactions. Give your health care provider a list of all the medicines, herbs, non-prescription drugs, or dietary supplements you use. Also tell them if you smoke, drink alcohol, or use illegal drugs. Some items may interact with your medicine. What should I watch for while using this medicine? Report any side effects that do not go away within 3 days to your doctor or health care professional. Call your health care provider if any unusual symptoms occur within 6 weeks of receiving this vaccine. You may still catch the flu, but the illness is not usually as bad. You cannot get the flu from the vaccine. The vaccine will not protect against colds or other illnesses that may cause fever. The vaccine is needed every year. What side effects may I notice from receiving this medicine? Side effects that you should report to your doctor or health care professional as soon as possible: -allergic reactions like skin rash, itching or hives, swelling of the face, lips, or tongue Side effects that usually do not require medical attention (report to your doctor or health care professional if they continue or are bothersome): -fever -headache -muscle aches and pains -pain, tenderness, redness, or swelling at the injection site -tiredness This list may not describe all possible side effects. Call your doctor for medical advice about side effects. You may report side effects to FDA at 1-800-FDA-1088. Where should I  keep my medicine? The vaccine will be given by a health care professional in a clinic, pharmacy, doctor's office, or  other health care setting. You will not be given vaccine doses to store at home. NOTE: This sheet is a summary. It may not cover all possible information. If you have questions about this medicine, talk to your doctor, pharmacist, or health care provider.  2014, Elsevier/Gold Standard. (2012-06-13 57:84:69)

## 2014-04-07 ENCOUNTER — Ambulatory Visit: Payer: BC Managed Care – PPO | Admitting: Family Medicine

## 2014-06-01 LAB — HM MAMMOGRAPHY

## 2014-06-25 ENCOUNTER — Telehealth: Payer: Self-pay | Admitting: Family Medicine

## 2014-06-25 NOTE — Telephone Encounter (Signed)
appt scheduled for 7/28 with [facility]

## 2014-07-14 ENCOUNTER — Encounter: Payer: Self-pay | Admitting: Family Medicine

## 2014-07-14 ENCOUNTER — Ambulatory Visit (INDEPENDENT_AMBULATORY_CARE_PROVIDER_SITE_OTHER): Payer: Medicare Other | Admitting: Family Medicine

## 2014-07-14 VITALS — BP 138/87 | HR 76 | Temp 98.2°F | Ht 65.5 in | Wt 162.0 lb

## 2014-07-14 DIAGNOSIS — E785 Hyperlipidemia, unspecified: Secondary | ICD-10-CM

## 2014-07-14 DIAGNOSIS — Z23 Encounter for immunization: Secondary | ICD-10-CM

## 2014-07-14 MED ORDER — ATORVASTATIN CALCIUM 40 MG PO TABS: ORAL_TABLET | ORAL | Status: DC

## 2014-07-14 NOTE — Progress Notes (Signed)
   Subjective:    Patient ID: Teresa Patel, female    DOB: April 12, 1951, 63 y.o.   MRN: [id number]  HPI 63 year old female who is here to followup her blood pressure and lipids. She is tolerating lisinopril 20 mg and Lipitor 40 mg without side effects. She does complain of some intermittent back pain today. There is a history of bulging disc some years back but her symptoms were more frequent. She controls her pain with ibuprofen and she wonders if she perhaps needs another MRI. She has no neurologic symptoms or deficits.    Review of Systems  Constitutional: Negative.   HENT: Negative.   Respiratory: Negative.   Cardiovascular: Negative.   Gastrointestinal: Negative.   Endocrine: Negative.   Genitourinary: Negative.   Neurological: Negative.   Psychiatric/Behavioral: Negative.        Objective:   Physical Exam  Constitutional: She is oriented to person, place, and time. She appears well-developed and well-nourished.  Eyes: Conjunctivae and EOM are normal.  Neck: Normal range of motion. Neck supple.  Cardiovascular: Normal rate, regular rhythm and normal heart sounds.   Pulmonary/Chest: Effort normal and breath sounds normal.  Abdominal: Soft. Bowel sounds are normal.  Musculoskeletal: Normal range of motion.  DTR's symmetric SLR negative  Neurological: She is alert and oriented to person, place, and time. She has normal reflexes.  Skin: Skin is warm and dry.  Psychiatric: She has a normal mood and affect. Her behavior is normal. Thought content normal.          Assessment & Plan:  1. HLD (hyperlipidemia) Lipid monitoring up to date - atorvastatin (LIPITOR) 40 MG tablet; One  daily  Dispense: 30 tablet; Refill: 5  2. LBP  Seems worse but no deficits  Rec no MRI for now  Wardell Honour MD

## 2014-07-14 NOTE — Patient Instructions (Signed)

## 2014-10-27 ENCOUNTER — Ambulatory Visit (INDEPENDENT_AMBULATORY_CARE_PROVIDER_SITE_OTHER): Payer: Medicare Other

## 2014-10-27 ENCOUNTER — Encounter (INDEPENDENT_AMBULATORY_CARE_PROVIDER_SITE_OTHER): Payer: Self-pay

## 2014-10-27 DIAGNOSIS — Z23 Encounter for immunization: Secondary | ICD-10-CM

## 2014-12-23 ENCOUNTER — Other Ambulatory Visit: Payer: Self-pay | Admitting: Family Medicine

## 2015-01-14 ENCOUNTER — Ambulatory Visit: Payer: Medicare Other | Admitting: Family Medicine

## 2015-01-27 ENCOUNTER — Encounter: Payer: Self-pay | Admitting: Family Medicine

## 2015-01-27 ENCOUNTER — Ambulatory Visit (INDEPENDENT_AMBULATORY_CARE_PROVIDER_SITE_OTHER): Payer: Medicare Other | Admitting: Family Medicine

## 2015-01-27 VITALS — BP 132/85 | HR 71 | Temp 97.6°F | Ht 65.5 in | Wt 161.0 lb

## 2015-01-27 DIAGNOSIS — E785 Hyperlipidemia, unspecified: Secondary | ICD-10-CM

## 2015-01-27 DIAGNOSIS — K219 Gastro-esophageal reflux disease without esophagitis: Secondary | ICD-10-CM

## 2015-01-27 DIAGNOSIS — I10 Essential (primary) hypertension: Secondary | ICD-10-CM

## 2015-01-27 DIAGNOSIS — F419 Anxiety disorder, unspecified: Secondary | ICD-10-CM

## 2015-01-27 NOTE — Progress Notes (Signed)
   Subjective:    Patient ID: Teresa Patel, female    DOB: 1951/12/05, 64 y.o.   MRN: [id number]  HPI 64 year old female here to follow-up hypertension hyperlipidemia. She is really doing well no problems or intolerance to her medication. She does relate today that her husband has had no third surgery for recurrent cholangiocarcinoma. He is having this treatment at [facility]. She also tells me about some lightheadedness when she looks up. The way she describes symptoms I suspect there may be some plaque in the posterior arteries in the neck and we discussed this. She is on blood pressure pills and statins and besides avoidance of working overhead I do not know another treatment for this.  Patient Active Problem List   Diagnosis Date Noted  . HTN (hypertension) 11/28/2013  . Need for prophylactic vaccination and inoculation against influenza 11/28/2013  . Hyperlipidemia   . Hypertension   . GERD (gastroesophageal reflux disease)   . Anxiety    Outpatient Encounter Prescriptions as of 01/27/2015  Medication Sig  . aspirin 81 MG tablet Take 81 mg by mouth daily.  Marland Kitchen atorvastatin (LIPITOR) 40 MG tablet TAKE ONE (1) TABLET EACH DAY  . lisinopril (PRINIVIL,ZESTRIL) 20 MG tablet Take 1 tablet (20 mg total) by mouth daily. Takes half daily  . Nutritional Supplements (GRAPESEED EXTRACT PO) Take by mouth.  . Omega-3 Fatty Acids (FISH OIL PO) Take by mouth.     Review of Systems  Constitutional: Negative.   HENT: Negative.   Eyes: Negative.   Respiratory: Negative.   Cardiovascular: Negative.   Gastrointestinal: Negative.   Endocrine: Negative.   Genitourinary: Negative.   Hematological: Negative.   Psychiatric/Behavioral: Negative.        Objective:   Physical Exam  Constitutional: She is oriented to person, place, and time. She appears well-developed and well-nourished.  Eyes: Conjunctivae and EOM are normal.  Neck: Normal range of motion. Neck supple.  Cardiovascular: Normal  rate, regular rhythm and normal heart sounds.   Pulmonary/Chest: Effort normal and breath sounds normal.  Abdominal: Soft. Bowel sounds are normal.  Musculoskeletal: Normal range of motion.  Neurological: She is alert and oriented to person, place, and time. She has normal reflexes.  Skin: Skin is warm and dry.  Psychiatric: She has a normal mood and affect. Her behavior is normal. Thought content normal.    BP 132/85 mmHg  Pulse 71  Temp(Src) 97.6 F (36.4 C) (Oral)  Ht 5' 5.5" (1.664 m)  Wt 161 lb (73.029 kg)  BMI 26.37 kg/m2       Assessment & Plan:  1. Essential hypertension Blood pressure controlled on current regimen of lisinopril. - CMP14+EGFR  2. Hyperlipidemia Would not check lipids and over one year so we will do lipids and liver today. She does take 40 mg of atorvastatin - Lipid panel  3. Gastroesophageal reflux disease, esophagitis presence not specified No symptoms at present  4. Anxiety Has had some anxiety related to her husband's illness and treatment but generally is doing well  Wardell Honour MD

## 2015-01-28 LAB — LIPID PANEL
Chol/HDL Ratio: 3.6 ratio units (ref 0.0–4.4)
Cholesterol, Total: 188 mg/dL (ref 100–199)
HDL: 52 mg/dL (ref >39)
LDL Calculated: 101 mg/dL — ABNORMAL HIGH (ref 0–99)
Triglycerides: 177 mg/dL — ABNORMAL HIGH (ref 0–149)
VLDL Cholesterol Cal: 35 mg/dL (ref 5–40)

## 2015-01-28 LAB — CMP14+EGFR
ALBUMIN: 4.3 g/dL (ref 3.6–4.8)
ALK PHOS: 70 IU/L (ref 39–117)
ALT: 24 IU/L (ref 0–32)
AST: 25 IU/L (ref 0–40)
Albumin/Globulin Ratio: 1.5 (ref 1.1–2.5)
BUN/Creatinine Ratio: 24 (ref 11–26)
BUN: 20 mg/dL (ref 8–27)
Bilirubin Total: 0.4 mg/dL (ref 0.0–1.2)
CO2: 24 mmol/L (ref 18–29)
CREATININE: 0.83 mg/dL (ref 0.57–1.00)
Calcium: 10.3 mg/dL (ref 8.7–10.3)
Chloride: 104 mmol/L (ref 97–108)
GFR calc Af Amer: 86 mL/min/{1.73_m2} (ref >59)
GFR, EST NON AFRICAN AMERICAN: 74 mL/min/{1.73_m2} (ref >59)
Globulin, Total: 2.9 g/dL (ref 1.5–4.5)
Glucose: 103 mg/dL — ABNORMAL HIGH (ref 65–99)
Potassium: 4.4 mmol/L (ref 3.5–5.2)
Sodium: 142 mmol/L (ref 134–144)
TOTAL PROTEIN: 7.2 g/dL (ref 6.0–8.5)

## 2015-02-01 ENCOUNTER — Telehealth: Payer: Self-pay | Admitting: *Deleted

## 2015-02-01 NOTE — Telephone Encounter (Signed)
-----   Message from Wardell Honour, MD sent at 01/29/2015  3:23 PM EST ----- Lipids are pretty much the same except triglycerides are up but not a big issue, just watch fat in diet a little closer.  Chemistries are good but glucose is up some in the pre-diabetes range.  Again no treatment just watch carbs

## 2015-02-01 NOTE — Telephone Encounter (Signed)
Pt notified of results Verbalizes understanding 

## 2015-03-04 ENCOUNTER — Other Ambulatory Visit: Payer: Self-pay | Admitting: Family Medicine

## 2015-06-04 LAB — HM MAMMOGRAPHY: HM MAMMO: NEGATIVE

## 2015-07-14 ENCOUNTER — Encounter: Payer: Self-pay | Admitting: *Deleted

## 2015-07-29 ENCOUNTER — Encounter: Payer: Self-pay | Admitting: Family Medicine

## 2015-07-29 ENCOUNTER — Ambulatory Visit (INDEPENDENT_AMBULATORY_CARE_PROVIDER_SITE_OTHER): Payer: Medicare Other | Admitting: Family Medicine

## 2015-07-29 ENCOUNTER — Ambulatory Visit (INDEPENDENT_AMBULATORY_CARE_PROVIDER_SITE_OTHER): Payer: Medicare Other

## 2015-07-29 VITALS — BP 125/71 | HR 66 | Temp 97.4°F | Ht 65.5 in | Wt 164.0 lb

## 2015-07-29 DIAGNOSIS — I10 Essential (primary) hypertension: Secondary | ICD-10-CM | POA: Diagnosis not present

## 2015-07-29 DIAGNOSIS — E785 Hyperlipidemia, unspecified: Secondary | ICD-10-CM

## 2015-07-29 DIAGNOSIS — Z23 Encounter for immunization: Secondary | ICD-10-CM

## 2015-07-29 DIAGNOSIS — Z780 Asymptomatic menopausal state: Secondary | ICD-10-CM | POA: Diagnosis not present

## 2015-07-29 NOTE — Progress Notes (Signed)
   Subjective:    Patient ID: Teresa Patel, female    DOB: Mar 23, 1951, 64 y.o.   MRN: [id number]  HPI pleasant 64 year old female here to follow-up blood pressure and lipids. Husband is having problems with his cholangiocarcinoma and they have decided to stop further chemotherapy. She is doing with this well. Since she is helping take care of him her weight has gone up a little bit as she does not get out and walk and encouraged her to watch her carbohydrate intake so that weight does not continue to increase.  Patient Active Problem List   Diagnosis Date Noted  . HTN (hypertension) 11/28/2013  . Need for prophylactic vaccination and inoculation against influenza 11/28/2013  . Hyperlipidemia   . Hypertension   . GERD (gastroesophageal reflux disease)   . Anxiety    Outpatient Encounter Prescriptions as of 07/29/2015  Medication Sig  . aspirin 81 MG tablet Take 81 mg by mouth daily.  Marland Kitchen atorvastatin (LIPITOR) 40 MG tablet TAKE ONE (1) TABLET EACH DAY  . lisinopril (PRINIVIL,ZESTRIL) 20 MG tablet Take 1 tablet (20 mg total) by mouth daily. Takes half daily  . Nutritional Supplements (GRAPESEED EXTRACT PO) Take by mouth.  . Omega-3 Fatty Acids (FISH OIL PO) Take by mouth.   No facility-administered encounter medications on file as of 07/29/2015.       Review of Systems  Constitutional: Negative.   HENT: Negative.   Eyes: Negative.   Respiratory: Negative.   Cardiovascular: Negative.   Gastrointestinal: Negative.   Endocrine: Negative.   Genitourinary: Negative.   Hematological: Negative.   Psychiatric/Behavioral: Negative.        Objective:   Physical Exam  Constitutional: She appears well-developed and well-nourished.  Cardiovascular: Normal rate, regular rhythm and intact distal pulses.   Pulmonary/Chest: Effort normal and breath sounds normal.  Abdominal: Soft. There is no tenderness.  Neurological: She is alert.  Psychiatric: She has a normal mood and affect. Her  behavior is normal.    BP 125/71 mmHg  Pulse 66  Temp(Src) 97.4 F (36.3 C) (Oral)  Ht 5' 5.5" (1.664 m)  Wt 164 lb (74.39 kg)  BMI 26.87 kg/m2       Assessment & Plan:  1. Post-menopausal Takes calcium and vitamin D sporadically. Encouraged regular use of that - DG Bone Density; Future   2. Essential hypertension Pressure well controlled with lisinopril with no side effects. Continue same  3. Hyperlipidemia Tolerating statin well when last checked 6 months ago numbers were at goal. Check lipids in 6 months  Wardell Honour MD

## 2015-08-17 ENCOUNTER — Other Ambulatory Visit: Payer: Self-pay | Admitting: Family Medicine

## 2015-10-19 ENCOUNTER — Ambulatory Visit (INDEPENDENT_AMBULATORY_CARE_PROVIDER_SITE_OTHER): Payer: Medicare Other

## 2015-10-19 DIAGNOSIS — Z23 Encounter for immunization: Secondary | ICD-10-CM | POA: Diagnosis not present

## 2015-10-22 ENCOUNTER — Other Ambulatory Visit: Payer: Self-pay | Admitting: Family Medicine

## 2015-11-25 ENCOUNTER — Ambulatory Visit (INDEPENDENT_AMBULATORY_CARE_PROVIDER_SITE_OTHER): Payer: Medicare Other | Admitting: Family Medicine

## 2015-11-25 ENCOUNTER — Encounter: Payer: Self-pay | Admitting: Family Medicine

## 2015-11-25 VITALS — BP 128/79 | HR 93 | Temp 99.0°F | Ht 65.5 in | Wt 164.8 lb

## 2015-11-25 DIAGNOSIS — R509 Fever, unspecified: Secondary | ICD-10-CM

## 2015-11-25 DIAGNOSIS — R6883 Chills (without fever): Secondary | ICD-10-CM

## 2015-11-25 DIAGNOSIS — N3000 Acute cystitis without hematuria: Secondary | ICD-10-CM

## 2015-11-25 DIAGNOSIS — R52 Pain, unspecified: Secondary | ICD-10-CM

## 2015-11-25 LAB — POCT UA - MICROSCOPIC ONLY
Bacteria, U Microscopic: NEGATIVE
CASTS, UR, LPF, POC: NEGATIVE
CRYSTALS, UR, HPF, POC: NEGATIVE
MUCUS UA: NEGATIVE
Yeast, UA: NEGATIVE

## 2015-11-25 LAB — POCT URINALYSIS DIPSTICK
BILIRUBIN UA: NEGATIVE
Glucose, UA: NEGATIVE
Ketones, UA: NEGATIVE
Nitrite, UA: NEGATIVE
PH UA: 6
PROTEIN UA: NEGATIVE
SPEC GRAV UA: 1.025
Urobilinogen, UA: NEGATIVE

## 2015-11-25 LAB — POCT INFLUENZA A/B
Influenza A, POC: NEGATIVE
Influenza B, POC: NEGATIVE

## 2015-11-25 MED ORDER — CIPROFLOXACIN HCL 250 MG PO TABS: 250.0000 mg | ORAL_TABLET | Freq: Two times a day (BID) | ORAL | Status: DC

## 2015-11-25 NOTE — Progress Notes (Addendum)
   HPI  Patient presents today here today with acute illness.  Patient finds that she's had fever, chills, and body aches over the last 3 days.  she feels overallpoorly but has no other symptoms such as dyspnea, chest pain, cough, sinus pressure, ear pain, or abdominal pain.  She has chronic low back pain which was slightly worse at the beginning of the symptoms. She has strong smelling urine with dark-colored but no dysuria or urinary frequency. She has no nausea or vomiting. She has a normal oral tolerance  PMH: Smoking status noted ROS: Per HPI  Objective: BP 128/79 mmHg  Pulse 93  Temp(Src) 99 F (37.2 C) (Oral)  Ht 5' 5.5" (1.664 m)  Wt 164 lb 12.8 oz (74.753 kg)  BMI 27.00 kg/m2 Gen: NAD, alert, cooperative with exam HEENT: NCAT, TMs normal bilaterally, PERRLA, oropharynx clear, nares clear CV: RRR, good S1/S2, no murmur Resp: CTABL, no wheezes, non-labored Abd: SNTND, BS present, no guarding or organomegaly, no CVA tenderness Ext: No edema, warm Neuro: Alert and oriented, No gross deficits  Assessment and plan:  # flulike illness Rapid flu is negative today,however I believe th 30% of positiveinfluenza cases that are missed by the rapid flu. She has had a flu shot which could explain why her symptoms are mild in nature for the flu. Supportive care Urinalysis pending, this is a UTI will  treat Discussed reasons to return to the clinic in great detail    Orders Placed This Encounter  Procedures  . POCT Influenza A/B  . POCT UA - Microscopic Only  . POCT urinalysis dipstick     Laroy Apple, MD [facility] 11/25/2015, 3:55 PM   Addendum  UA consistent with UTI, treat with cipro, sent for culture Notified by nursing by phone.   Laroy Apple, MD [facility] 11/25/2015, 7:02 PM

## 2015-11-25 NOTE — Patient Instructions (Signed)
Great to meet you!  For now we do not see any discrete infections.   We will call right away if there is a UTI  Please come back if anything changes or you develop any concerning symptoms.    For now you should get rest, fluids, and take tylenol or ibuprfen as needed.

## 2015-11-25 NOTE — Addendum Note (Signed)
Addended by: Timmothy Euler on: 11/25/2015 07:02 PM   Modules accepted: Orders

## 2015-11-25 NOTE — Addendum Note (Signed)
Addended by: Earlene Plater on: 11/25/2015 05:37 PM   Modules accepted: Orders

## 2015-11-27 LAB — URINE CULTURE

## 2016-02-03 ENCOUNTER — Ambulatory Visit: Payer: Medicare Other | Admitting: Family Medicine

## 2016-02-03 DIAGNOSIS — H40053 Ocular hypertension, bilateral: Secondary | ICD-10-CM | POA: Diagnosis not present

## 2016-02-04 ENCOUNTER — Ambulatory Visit (INDEPENDENT_AMBULATORY_CARE_PROVIDER_SITE_OTHER): Payer: Medicare Other | Admitting: Family Medicine

## 2016-02-04 ENCOUNTER — Encounter: Payer: Self-pay | Admitting: Family Medicine

## 2016-02-04 VITALS — BP 118/76 | HR 69 | Temp 97.0°F | Ht 65.5 in | Wt 164.0 lb

## 2016-02-04 DIAGNOSIS — K219 Gastro-esophageal reflux disease without esophagitis: Secondary | ICD-10-CM | POA: Diagnosis not present

## 2016-02-04 DIAGNOSIS — E785 Hyperlipidemia, unspecified: Secondary | ICD-10-CM

## 2016-02-04 DIAGNOSIS — I10 Essential (primary) hypertension: Secondary | ICD-10-CM | POA: Diagnosis not present

## 2016-02-04 MED ORDER — LISINOPRIL 20 MG PO TABS: ORAL_TABLET | ORAL | Status: DC

## 2016-02-04 MED ORDER — ATORVASTATIN CALCIUM 40 MG PO TABS: ORAL_TABLET | ORAL | Status: DC

## 2016-02-04 NOTE — Patient Instructions (Signed)
Medicare Annual Wellness Visit  [place] and the medical providers at [facility] strive to bring you the best medical care.  In doing so we not only want to address your current medical conditions and concerns but also to detect new conditions early and prevent illness, disease and health-related problems.    Medicare offers a yearly Wellness Visit which allows our clinical staff to assess your need for preventative services including immunizations, lifestyle education, counseling to decrease risk of preventable diseases and screening for fall risk and other medical concerns.    This visit is provided free of charge (no copay) for all Medicare recipients. The clinical pharmacists at [facility] have begun to conduct these Wellness Visits which will also include a thorough review of all your medications.    As you primary medical provider recommend that you make an appointment for your Annual Wellness Visit if you have not done so already this year.  You may set up this appointment before you leave today or you may call back ([phone]) and schedule an appointment.  Please make sure when you call that you mention that you are scheduling your Annual Wellness Visit with the clinical pharmacist so that the appointment may be made for the proper length of time.     Continue current medications. Continue good therapeutic lifestyle changes which include good diet and exercise. Fall precautions discussed with patient. If an FOBT was given today- please return it to our front desk. If you are over 50 years old - you may need Prevnar 13 or the adult Pneumonia vaccine.  **Flu shots are available--- please call and schedule a FLU-CLINIC appointment**  After your visit with us today you will receive a survey in the mail or online from [employer] regarding your care with us. Please take a moment to fill this out. Your feedback is very  important to us as you can help us better understand your patient needs as well as improve your experience and satisfaction. WE CARE ABOUT YOU!!!    

## 2016-02-04 NOTE — Progress Notes (Signed)
   Subjective:    Patient ID: Teresa Patel, female    DOB: [birth year], 65 y.o.   MRN: [id number]  HPI Pt here for follow up and management of chronic medical problems which includes hypertension and hyperlipidemia. She is taking medications regularly. Patient is a 65 year old [place] grandmother who is basically healthy. She has some hypertension and lipid issues but no other chronic disease. Her lipids were last checked 1 year ago and LDL was almost at goal at 101. Blood pressures continue to be well managed on lisinopril. Pressure today was 118/76.      Patient Active Problem List   Diagnosis Date Noted  . HTN (hypertension) 11/28/2013  . Need for prophylactic vaccination and inoculation against influenza 11/28/2013  . Hyperlipidemia   . Hypertension   . GERD (gastroesophageal reflux disease)   . Anxiety    Outpatient Encounter Prescriptions as of 02/04/2016  Medication Sig  . aspirin 81 MG tablet Take 81 mg by mouth daily.  Marland Kitchen atorvastatin (LIPITOR) 40 MG tablet TAKE ONE (1) TABLET EACH DAY  . lisinopril (PRINIVIL,ZESTRIL) 20 MG tablet TAKE ONE (1) TABLET EACH DAY  . Nutritional Supplements (GRAPESEED EXTRACT PO) Take by mouth.  . Omega-3 Fatty Acids (FISH OIL PO) Take by mouth.  . [DISCONTINUED] ciprofloxacin (CIPRO) 250 MG tablet Take 1 tablet (250 mg total) by mouth 2 (two) times daily.   No facility-administered encounter medications on file as of 02/04/2016.     Review of Systems  Constitutional: Negative.   HENT: Negative.   Eyes: Negative.   Respiratory: Negative.   Cardiovascular: Negative.   Gastrointestinal: Negative.   Endocrine: Negative.   Genitourinary: Negative.   Musculoskeletal: Negative.   Skin: Negative.   Allergic/Immunologic: Negative.   Neurological: Negative.   Hematological: Negative.   Psychiatric/Behavioral: Negative.        Objective:   Physical Exam  Constitutional: She is oriented to person, place, and time. She appears well-developed  and well-nourished.  HENT:  Head: Normocephalic.  Eyes: Pupils are equal, round, and reactive to light.  Cardiovascular: Normal rate, regular rhythm, normal heart sounds and intact distal pulses.   Pulmonary/Chest: Effort normal and breath sounds normal.  Abdominal: Soft.  Neurological: She is oriented to person, place, and time.  Psychiatric: She has a normal mood and affect. Her behavior is normal.   BP 118/76 mmHg  Pulse 69  Temp(Src) 97 F (36.1 C) (Oral)  Ht 5' 5.5" (1.664 m)  Wt 164 lb (74.39 kg)  BMI 26.87 kg/m2        Assessment & Plan:  1. Essential hypertension Patient taking lisinopril no side effects. Pressures are well managed - CMP14+EGFR  2. Hyperlipidemia Taking atorvastatin will do annual check today along with liver tests. - Lipid panel  3. Gastroesophageal reflux disease, esophagitis presence not specified Symptoms were not mentioned a day and she is not on any acid inhibitors.  Wardell Honour MD

## 2016-02-05 LAB — LIPID PANEL
CHOLESTEROL TOTAL: 167 mg/dL (ref 100–199)
Chol/HDL Ratio: 3.5 ratio units (ref 0.0–4.4)
HDL: 48 mg/dL (ref >39)
LDL Calculated: 89 mg/dL (ref 0–99)
TRIGLYCERIDES: 152 mg/dL — AB (ref 0–149)
VLDL Cholesterol Cal: 30 mg/dL (ref 5–40)

## 2016-02-05 LAB — CMP14+EGFR
A/G RATIO: 1.7 (ref 1.1–2.5)
ALK PHOS: 65 IU/L (ref 39–117)
ALT: 23 IU/L (ref 0–32)
AST: 26 IU/L (ref 0–40)
Albumin: 4.3 g/dL (ref 3.6–4.8)
BILIRUBIN TOTAL: 0.5 mg/dL (ref 0.0–1.2)
BUN/Creatinine Ratio: 17 (ref 11–26)
BUN: 14 mg/dL (ref 8–27)
CHLORIDE: 103 mmol/L (ref 96–106)
CO2: 24 mmol/L (ref 18–29)
Calcium: 9.7 mg/dL (ref 8.7–10.3)
Creatinine, Ser: 0.84 mg/dL (ref 0.57–1.00)
GFR calc non Af Amer: 73 mL/min/{1.73_m2} (ref >59)
GFR, EST AFRICAN AMERICAN: 84 mL/min/{1.73_m2} (ref >59)
GLUCOSE: 101 mg/dL — AB (ref 65–99)
Globulin, Total: 2.6 g/dL (ref 1.5–4.5)
POTASSIUM: 4.2 mmol/L (ref 3.5–5.2)
Sodium: 143 mmol/L (ref 134–144)
Total Protein: 6.9 g/dL (ref 6.0–8.5)

## 2016-02-08 ENCOUNTER — Encounter: Payer: Self-pay | Admitting: *Deleted

## 2016-02-21 ENCOUNTER — Ambulatory Visit (INDEPENDENT_AMBULATORY_CARE_PROVIDER_SITE_OTHER): Payer: Medicare Other | Admitting: Family Medicine

## 2016-02-21 ENCOUNTER — Encounter: Payer: Self-pay | Admitting: Family Medicine

## 2016-02-21 VITALS — BP 139/83 | HR 70 | Temp 97.1°F | Ht 65.5 in | Wt 164.4 lb

## 2016-02-21 DIAGNOSIS — J101 Influenza due to other identified influenza virus with other respiratory manifestations: Secondary | ICD-10-CM | POA: Diagnosis not present

## 2016-02-21 MED ORDER — GUAIFENESIN-CODEINE 100-10 MG/5ML PO SYRP: 5.0000 mL | ORAL_SOLUTION | ORAL | Status: DC | PRN

## 2016-02-21 MED ORDER — OSELTAMIVIR PHOSPHATE 75 MG PO CAPS: 75.0000 mg | ORAL_CAPSULE | Freq: Two times a day (BID) | ORAL | Status: DC

## 2016-02-21 NOTE — Patient Instructions (Signed)

## 2016-02-21 NOTE — Progress Notes (Signed)
Subjective:  Patient ID: Teresa Patel, female    DOB: Oct 19, 1951  Age: 65 y.o. MRN: [id number]  CC: Cough; Fever; Nasal Congestion; and Anorexia   HPI Teresa Patel presents for onset several days ago as LGF, dry cough, clear rhino. Temporary relief with Mucinex D.   History  Teresa Patel has a past medical history of Hyperlipidemia; Hypertension; GERD (gastroesophageal reflux disease); and Anxiety.   She has past surgical history that includes Abdominal hysterectomy.   Her family history includes Cancer in her brother; Diabetes in her brother and father; Heart disease in her brother, brother, father, and mother.She reports that she has never smoked. She has never used smokeless tobacco. She reports that she does not drink alcohol or use illicit drugs.    ROS Review of Systems  Constitutional: Positive for fever, chills and appetite change.  HENT: Positive for congestion and rhinorrhea. Negative for ear pain, nosebleeds, postnasal drip, sinus pressure and sore throat.   Respiratory: Negative for chest tightness and shortness of breath.   Cardiovascular: Negative for chest pain.  Musculoskeletal: Positive for myalgias.  Skin: Negative for rash.  Neurological: Positive for headaches.    Objective:  BP 139/83 mmHg  Pulse 70  Temp(Src) 97.1 F (36.2 C) (Oral)  Ht 5' 5.5" (1.664 m)  Wt 164 lb 6.4 oz (74.571 kg)  BMI 26.93 kg/m2  BP Readings from Last 3 Encounters:  02/21/16 139/83  02/04/16 118/76  11/25/15 128/79    Wt Readings from Last 3 Encounters:  02/21/16 164 lb 6.4 oz (74.571 kg)  02/04/16 164 lb (74.39 kg)  11/25/15 164 lb 12.8 oz (74.753 kg)     Physical Exam  Constitutional: She appears well-developed and well-nourished.  HENT:  Head: Normocephalic and atraumatic.  Right Ear: Tympanic membrane and external ear normal. No decreased hearing is noted.  Left Ear: Tympanic membrane and external ear normal. No decreased hearing is noted.  Nose: Mucosal edema  present. Right sinus exhibits no frontal sinus tenderness. Left sinus exhibits no frontal sinus tenderness.  Mouth/Throat: No oropharyngeal exudate or posterior oropharyngeal erythema.  Neck: No Brudzinski's sign noted.  Pulmonary/Chest: Breath sounds normal. No respiratory distress.  Lymphadenopathy:       Head (right side): No preauricular adenopathy present.       Head (left side): No preauricular adenopathy present.       Right cervical: No superficial cervical adenopathy present.      Left cervical: No superficial cervical adenopathy present.     Lab Results  Component Value Date   WBC 8.0 03/05/2013   HGB 13.4 03/05/2013   HCT 40.8 03/05/2013   GLUCOSE 101* 02/04/2016   CHOL 167 02/04/2016   TRIG 152* 02/04/2016   HDL 48 02/04/2016   LDLCALC 89 02/04/2016   ALT 23 02/04/2016   AST 26 02/04/2016   NA 143 02/04/2016   K 4.2 02/04/2016   CL 103 02/04/2016   CREATININE 0.84 02/04/2016   BUN 14 02/04/2016   CO2 24 02/04/2016    Mr Lumbar Spine Wo Contrast  08/28/2004  Clinical Data: Chronic low back pain. Since June, 2005 pain has radiated down the left hip into the left leg extending into the left calf. Plain films demonstrate five typical lumbar segments.  MRI OF THE LUMBAR SPINE WITHOUT CONTRAST  The scan extends from T11-12 through the second sacral segment. Conus medullaris terminates at L-1 and appears normal.  The intervertebral discs at T11-12, T12-L1, and L1-2 are normal.  L2-3: There is  an asymmetric slight bulge of the disc paracentral and to the left and into the left neural foramen which narrows the left lateral recess which could compress the left L-3 nerve root slightly. No discrete disc herniation at that level. L3-4: Normal. L4-5: There is marked hypertrophy of the facet joints and ligamentum flavum with a diffuse mild bulge of the disc slightly more prominent into the left lateral recess and left neural foramen. This combination of abnormalities creates a severe  spinal stenosis with bilateral lateral recess stenosis, left greater than right. This should affect both L-5 nerve roots, left greater than right.  L5-S1: There is severe left facet joint arthritis without discrete neural impingement.  IMPRESSION 1. Severe spinal stenosis at L4-5 due to ligamentum flavum and facet joint hypertrophy and a diffuse bulge of the disc asymmetric to the left. Both L-5 nerve roots appear compressed in the lateral recesses, left greater than right. 2. Asymmetric bulge at L2-3 asymmetric to the left narrowing the left lateral recess which could possible affect the left L-3 nerve root.  3. Severe facet joint arthritis at L5-S1 on the left without impingement. Provider: [place]:   Teresa Patel was seen today for cough, fever, nasal congestion and anorexia.  Diagnoses and all orders for this visit:  Influenza A  Other orders -     oseltamivir (TAMIFLU) 75 MG capsule; Take 1 capsule (75 mg total) by mouth 2 (two) times daily. -     guaiFENesin-codeine (CHERATUSSIN AC) 100-10 MG/5ML syrup; Take 5 mLs by mouth every 4 (four) hours as needed for cough.    Flu info printed on AVS  I am having Teresa Patel start on oseltamivir and guaiFENesin-codeine. I am also having her maintain her aspirin, Omega-3 Fatty Acids (FISH OIL PO), Nutritional Supplements (GRAPESEED EXTRACT PO), atorvastatin, and lisinopril.  Meds ordered this encounter  Medications  . oseltamivir (TAMIFLU) 75 MG capsule    Sig: Take 1 capsule (75 mg total) by mouth 2 (two) times daily.    Dispense:  10 capsule    Refill:  0  . guaiFENesin-codeine (CHERATUSSIN AC) 100-10 MG/5ML syrup    Sig: Take 5 mLs by mouth every 4 (four) hours as needed for cough.    Dispense:  180 mL    Refill:  0     Follow-up: Return if symptoms worsen or fail to improve.  Claretta Fraise, M.D.

## 2016-03-13 ENCOUNTER — Ambulatory Visit (INDEPENDENT_AMBULATORY_CARE_PROVIDER_SITE_OTHER): Payer: Medicare Other | Admitting: Pharmacist

## 2016-03-13 ENCOUNTER — Encounter: Payer: Self-pay | Admitting: Pharmacist

## 2016-03-13 VITALS — BP 140/82 | HR 74 | Ht 65.5 in | Wt 167.0 lb

## 2016-03-13 DIAGNOSIS — Z1211 Encounter for screening for malignant neoplasm of colon: Secondary | ICD-10-CM

## 2016-03-13 DIAGNOSIS — Z0000 Encounter for general adult medical examination without abnormal findings: Secondary | ICD-10-CM | POA: Diagnosis not present

## 2016-03-13 NOTE — Patient Instructions (Addendum)
Teresa Patel , Thank you for taking time to come for your Medicare Wellness Visit. I appreciate your ongoing commitment to your health goals. Please review the following plan we discussed and let me know if I can assist you in the future.   These are the goals we discussed: Continue to stay active - goal is to exercise 150 minutes per week.  Increase non-starchy vegetables - carrots, green bean, squash, zucchini, tomatoes, onions, peppers, spinach and other green leafy vegetables, cabbage, lettuce, cucumbers, asparagus, okra (not fried), eggplant limit sugar and processed foods (cakes, cookies, ice cream, crackers and chips) Increase fresh fruit but limit serving sizes 1/2 cup or about the size of tennis or baseball limit red meat to no more than 1-2 times per week (serving size about the size of your palm) Choose whole grains / lean proteins - whole wheat bread, quinoa, whole grain rice (1/2 cup), fish, chicken, [place]   This is a list of the screening recommended for you and due dates:  Health Maintenance  Topic Date Due  . Shingles Vaccine  04/13/2009  . Tetanus Vaccine  12/18/2014 - check on previous tetanus vaccine ?01/2008 (cost verified today was $45)  .  Hepatitis C: One time screening is recommended by Center for Disease Control  (CDC) for  adults born from [birth year] through 1965.   09/03/2016*  . Colon Cancer Screening  05/02/2016  . Flu Shot  07/18/2016  . Mammogram  06/03/2017  . DEXA scan (bone density measurement)  05/28/2018  . Pneumonia vaccines  Completed  *Topic was postponed. The date shown is not the original due date.   Health Maintenance, Female Adopting a healthy lifestyle and getting preventive care can go a long way to promote health and wellness. Talk with your health care provider about what schedule of regular examinations is right for you. This is a good chance for you to check in with your provider about disease prevention and staying healthy. In between checkups,  there are plenty of things you can do on your own. Experts have done a lot of research about which lifestyle changes and preventive measures are most likely to keep you healthy. Ask your health care provider for more information. WEIGHT AND DIET  Eat a healthy diet  Be sure to include plenty of vegetables, fruits, low-fat dairy products, and lean protein.  Do not eat a lot of foods high in solid fats, added sugars, or salt.  Get regular exercise. This is one of the most important things you can do for your health.  Most adults should exercise for at least 150 minutes each week. The exercise should increase your heart rate and make you sweat (moderate-intensity exercise).  Most adults should also do strengthening exercises at least twice a week. This is in addition to the moderate-intensity exercise.  Maintain a healthy weight  Body mass index (BMI) is a measurement that can be used to identify possible weight problems. It estimates body fat based on height and weight. Your health care provider can help determine your BMI and help you achieve or maintain a healthy weight.  For females 72 years of age and older:   A BMI below 18.5 is considered underweight.  A BMI of 18.5 to 24.9 is normal.  A BMI of 25 to 29.9 is considered overweight.  A BMI of 30 and above is considered obese.  Watch levels of cholesterol and blood lipids  You should start having your blood tested for lipids and cholesterol at  67 years of age, then have this test every 5 years.  You may need to have your cholesterol levels checked more often if:  Your lipid or cholesterol levels are high.  You are older than 67 years of age.  You are at high risk for heart disease.  CANCER SCREENING   Lung Cancer  Lung cancer screening is recommended for adults 56-25 years old who are at high risk for lung cancer because of a history of smoking.  A yearly low-dose CT scan of the lungs is recommended for people  who:  Currently smoke.  Have quit within the past 15 years.  Have at least a 30-pack-year history of smoking. A pack year is smoking an average of one pack of cigarettes a day for 1 year.  Yearly screening should continue until it has been 15 years since you quit.  Yearly screening should stop if you develop a health problem that would prevent you from having lung cancer treatment.  Breast Cancer  Practice breast self-awareness. This means understanding how your breasts normally appear and feel.  It also means doing regular breast self-exams. Let your health care provider know about any changes, no matter how small.  If you are in your 67s or 30s, you should have a clinical breast exam (CBE) by a health care provider every 1-3 years as part of a regular health exam.  If you are 67 or older, have a CBE every year. Also consider having a breast X-ray (mammogram) every year.  If you have a family history of breast cancer, talk to your health care provider about genetic screening.  If you are at high risk for breast cancer, talk to your health care provider about having an MRI and a mammogram every year.  Breast cancer gene (BRCA) assessment is recommended for women who have family members with BRCA-related cancers. BRCA-related cancers include:  Breast.  Ovarian.  Tubal.  Peritoneal cancers.  Results of the assessment will determine the need for genetic counseling and BRCA1 and BRCA2 testing. Cervical Cancer Your health care provider may recommend that you be screened regularly for cancer of the pelvic organs (ovaries, uterus, and vagina). This screening involves a pelvic examination, including checking for microscopic changes to the surface of your cervix (Pap test). You may be encouraged to have this screening done every 3 years, beginning at age 67.  For women ages 2-65, health care providers may recommend pelvic exams and Pap testing every 3 years, or they may recommend the  Pap and pelvic exam, combined with testing for human papilloma virus (HPV), every 5 years. Some types of HPV increase your risk of cervical cancer. Testing for HPV may also be done on women of any age with unclear Pap test results.  Other health care providers may not recommend any screening for nonpregnant women who are considered low risk for pelvic cancer and who do not have symptoms. Ask your health care provider if a screening pelvic exam is right for you.  If you have had past treatment for cervical cancer or a condition that could lead to cancer, you need Pap tests and screening for cancer for at least 20 years after your treatment. If Pap tests have been discontinued, your risk factors (such as having a new sexual partner) need to be reassessed to determine if screening should resume. Some women have medical problems that increase the chance of getting cervical cancer. In these cases, your health care provider may recommend more frequent screening and  Pap tests. Colorectal Cancer  This type of cancer can be detected and often prevented.  Routine colorectal cancer screening usually begins at 67 years of age and continues through 67 years of age.  Your health care provider may recommend screening at an earlier age if you have risk factors for colon cancer.  Your health care provider may also recommend using home test kits to check for hidden blood in the stool.  A small camera at the end of a tube can be used to examine your colon directly (sigmoidoscopy or colonoscopy). This is done to check for the earliest forms of colorectal cancer.  Routine screening usually begins at age 37.  Direct examination of the colon should be repeated every 5-10 years through 67 years of age. However, you may need to be screened more often if early forms of precancerous polyps or small growths are found. Skin Cancer  Check your skin from head to toe regularly.  Tell your health care provider about any new  moles or changes in moles, especially if there is a change in a mole's shape or color.  Also tell your health care provider if you have a mole that is larger than the size of a pencil eraser.  Always use sunscreen. Apply sunscreen liberally and repeatedly throughout the day.  Protect yourself by wearing long sleeves, pants, a wide-brimmed hat, and sunglasses whenever you are outside. HEART DISEASE, DIABETES, AND HIGH BLOOD PRESSURE   High blood pressure causes heart disease and increases the risk of stroke. High blood pressure is more likely to develop in:  People who have blood pressure in the high end of the normal range (130-139/85-89 mm Hg).  People who are overweight or obese.  People who are African American.  If you are 36-44 years of age, have your blood pressure checked every 3-5 years. If you are 22 years of age or older, have your blood pressure checked every year. You should have your blood pressure measured twice--once when you are at a hospital or clinic, and once when you are not at a hospital or clinic. Record the average of the two measurements. To check your blood pressure when you are not at a hospital or clinic, you can use:  An automated blood pressure machine at a pharmacy.  A home blood pressure monitor.  If you are between 110 years and 76 years old, ask your health care provider if you should take aspirin to prevent strokes.  Have regular diabetes screenings. This involves taking a blood sample to check your fasting blood sugar level.  If you are at a normal weight and have a low risk for diabetes, have this test once every three years after 67 years of age.  If you are overweight and have a high risk for diabetes, consider being tested at a younger age or more often. PREVENTING INFECTION  Hepatitis B  If you have a higher risk for hepatitis B, you should be screened for this virus. You are considered at high risk for hepatitis B if:  You were born in a  country where hepatitis B is common. Ask your health care provider which countries are considered high risk.  Your parents were born in a high-risk country, and you have not been immunized against hepatitis B (hepatitis B vaccine).  You have HIV or AIDS.  You use needles to inject street drugs.  You live with someone who has hepatitis B.  You have had sex with someone who has hepatitis  B.  You get hemodialysis treatment.  You take certain medicines for conditions, including cancer, organ transplantation, and autoimmune conditions. Hepatitis C  Blood testing is recommended for:  Everyone born from [birth year] through 1967.  Anyone with known risk factors for hepatitis C. Sexually transmitted infections (STIs)  You should be screened for sexually transmitted infections (STIs) including gonorrhea and chlamydia if:  You are sexually active and are younger than 67 years of age.  You are older than 67 years of age and your health care provider tells you that you are at risk for this type of infection.  Your sexual activity has changed since you were last screened and you are at an increased risk for chlamydia or gonorrhea. Ask your health care provider if you are at risk.  If you do not have HIV, but are at risk, it may be recommended that you take a prescription medicine daily to prevent HIV infection. This is called pre-exposure prophylaxis (PrEP). You are considered at risk if:  You are sexually active and do not regularly use condoms or know the HIV status of your partner(s).  You take drugs by injection.  You are sexually active with a partner who has HIV. Talk with your health care provider about whether you are at high risk of being infected with HIV. If you choose to begin PrEP, you should first be tested for HIV. You should then be tested every 3 months for as long as you are taking PrEP.  PREGNANCY   If you are premenopausal and you may become pregnant, ask your health care  provider about preconception counseling.  If you may become pregnant, take 400 to 800 micrograms (mcg) of folic acid every day.  If you want to prevent pregnancy, talk to your health care provider about birth control (contraception). OSTEOPOROSIS AND MENOPAUSE   Osteoporosis is a disease in which the bones lose minerals and strength with aging. This can result in serious bone fractures. Your risk for osteoporosis can be identified using a bone density scan.  If you are 20 years of age or older, or if you are at risk for osteoporosis and fractures, ask your health care provider if you should be screened.  Ask your health care provider whether you should take a calcium or vitamin D supplement to lower your risk for osteoporosis.  Menopause may have certain physical symptoms and risks.  Hormone replacement therapy may reduce some of these symptoms and risks. Talk to your health care provider about whether hormone replacement therapy is right for you.  HOME CARE INSTRUCTIONS   Schedule regular health, dental, and eye exams.  Stay current with your immunizations.   Do not use any tobacco products including cigarettes, chewing tobacco, or electronic cigarettes.  If you are pregnant, do not drink alcohol.  If you are breastfeeding, limit how much and how often you drink alcohol.  Limit alcohol intake to no more than 1 drink per day for nonpregnant women. One drink equals 12 ounces of beer, 5 ounces of wine, or 1 ounces of hard liquor.  Do not use street drugs.  Do not share needles.  Ask your health care provider for help if you need support or information about quitting drugs.  Tell your health care provider if you often feel depressed.  Tell your health care provider if you have ever been abused or do not feel safe at home.   This information is not intended to replace advice given to you by your health  care provider. Make sure you discuss any questions you have with your health  care provider.   Document Released: 06/19/2011 Document Revised: 12/25/2014 Document Reviewed: 11/05/2013 Elsevier Interactive Patient Education [employer].

## 2016-03-13 NOTE — Progress Notes (Signed)
Patient ID: Teresa Patel, female   DOB: 09-22-1951, 65 y.o.   MRN: [id number]    Subjective:   Teresa Patel is a 65 y.o. white, female who presents for an Initial Medicare Annual Wellness Visit. She lives with her husband in [place], [place].  She is pleasant and NAD.  She denies any medical concerns today.   Review of Systems  Review of Systems  Constitutional: Negative.   HENT: Negative.   Eyes: Negative.   Respiratory: Negative.   Cardiovascular: Negative.   Gastrointestinal: Positive for heartburn (occasional - take zantac as needed - about 2-3 times per month).  Genitourinary: Negative.   Musculoskeletal: Positive for back pain (has history of bulgin disc; no surgery; take IBU or APAP as needed (about 1 -2 times per week)).  Skin: Negative.   Neurological: Negative.   Endo/Heme/Allergies: Negative.   Psychiatric/Behavioral: Negative.     Current Medications (verified) Outpatient Encounter Prescriptions as of 03/13/2016  Medication Sig  . atorvastatin (LIPITOR) 40 MG tablet TAKE ONE (1) TABLET EACH DAY  . co-enzyme Q-10 50 MG capsule Take 50 mg by mouth daily.  Marland Kitchen ibuprofen (ADVIL,MOTRIN) 200 MG tablet Take 200 mg by mouth as needed.  . Nutritional Supplements (GRAPESEED EXTRACT PO) Take by mouth.  . ranitidine (ZANTAC) 75 MG tablet Take 75 mg by mouth daily as needed for heartburn.  Marland Kitchen aspirin 81 MG tablet Take 81 mg by mouth daily. Reported on 03/13/2016  . lisinopril (PRINIVIL,ZESTRIL) 20 MG tablet TAKE ONE (1) TABLET EACH DAY (Patient taking differently: Take 10 mg by mouth daily. TAKE ONE (1) TABLET EACH DAY)  . [DISCONTINUED] guaiFENesin-codeine (CHERATUSSIN AC) 100-10 MG/5ML syrup Take 5 mLs by mouth every 4 (four) hours as needed for cough. (Patient not taking: Reported on 03/13/2016)  . [DISCONTINUED] Omega-3 Fatty Acids (FISH OIL PO) Take by mouth. Reported on 03/13/2016  . [DISCONTINUED] oseltamivir (TAMIFLU) 75 MG capsule Take 1 capsule (75 mg total) by mouth 2 (two)  times daily. (Patient not taking: Reported on 03/13/2016)   No facility-administered encounter medications on file as of 03/13/2016.    Allergies (verified) Review of patient's allergies indicates no known allergies.   History: Past Medical History  Diagnosis Date  . Hyperlipidemia   . Hypertension   . GERD (gastroesophageal reflux disease)   . Anxiety   . Shingles    Past Surgical History  Procedure Laterality Date  . Abdominal hysterectomy    . Carpal tunnel Bilateral    Family History  Problem Relation Age of Onset  . Heart disease Mother   . Heart disease Father   . Cancer Father     prostate  . Cancer Brother     pancreatic  . Diabetes Brother   . Heart disease Brother   . Cancer Brother     prostate  . Heart disease Brother    Social History   Occupational History  . Not on file.   Social History Main Topics  . Smoking status: Never Smoker   . Smokeless tobacco: Never Used  . Alcohol Use: No  . Drug Use: No  . Sexual Activity: Yes    Do you feel safe at home?  Yes  Dietary issues and exercise activities: Current Exercise Habits: Home exercise routine, Type of exercise: walking, Time (Minutes): 15, Frequency (Times/Week): 2, Weekly Exercise (Minutes/Week): 30, Intensity: Mild  Current Dietary habits:  Tries to eat lots of vegetables.  Admits to eating out quit a bit.     Objective:  Today's Vitals   03/13/16 1003  BP: 140/82  Pulse: 74  Height: 5' 5.5" (1.664 m)  Weight: 167 lb (75.751 kg)  PainSc: 0-No pain   Body mass index is 27.36 kg/(m^2).  Activities of Daily Living In your present state of health, do you have any difficulty performing the following activities: 03/13/2016  Hearing? N  Vision? N  Difficulty concentrating or making decisions? N  Walking or climbing stairs? N  Dressing or bathing? N  Doing errands, shopping? N  Preparing Food and eating ? N  Using the Toilet? N  In the past six months, have you accidently leaked  urine? N  Do you have problems with loss of bowel control? N  Managing your Medications? N  Managing your Finances? N  Housekeeping or managing your Housekeeping? N    Are there smokers in your home (other than you)? No   Cardiac Risk Factors include: advanced age (>79men, >60 women);dyslipidemia;family history of premature cardiovascular disease;hypertension;sedentary lifestyle  Depression Screen PHQ 2/9 Scores 03/13/2016 02/04/2016 01/27/2015 07/14/2014  PHQ - 2 Score 0 0 0 0    Fall Risk Fall Risk  03/13/2016 02/04/2016 01/27/2015 07/14/2014  Falls in the past year? No No No No    Cognitive Function: MMSE - Mini Mental State Exam 03/13/2016  Orientation to time 5  Orientation to Place 5  Registration 3  Attention/ Calculation 5  Recall 3  Language- name 2 objects 2  Language- repeat 1  Language- follow 3 step command 3  Language- read & follow direction 1  Write a sentence 1  Copy design 1  Total score 30    Immunizations and Health Maintenance Immunization History  Administered Date(s) Administered  . Influenza Whole 10/25/2012  . Influenza,inj,Quad PF,36+ Mos 11/28/2013, 10/27/2014, 10/19/2015  . Pneumococcal Conjugate-13 07/14/2014  . Pneumococcal Polysaccharide-23 07/29/2015  . Td 01/27/2008   Health Maintenance Due  Topic Date Due  . ZOSTAVAX  04/13/2009  . TETANUS/TDAP  12/18/2014    Patient Care Team: Wardell Honour, MD as PCP - General (Family Medicine)  Indicate any recent Medical Services you may have received from other than [facility] providers in the past year (date may be approximate).    Assessment:    Annual Wellness Visit    Screening Tests Health Maintenance  Topic Date Due  . ZOSTAVAX  04/13/2009  . TETANUS/TDAP  12/18/2014  . Hepatitis C Screening  09/03/2016 (Originally 11/25/1951)  . COLONOSCOPY  05/02/2016  . INFLUENZA VACCINE  07/18/2016  . MAMMOGRAM  06/03/2017  . DEXA SCAN  05/28/2018  . PNA vac Low Risk Adult  Completed          Plan:   During the course of the visit Nyia was educated and counseled about the following appropriate screening and preventive services:   Vaccines to include Pneumoccal, Influenza, Hepatitis B, Td, Zostavax - patient to check on last Tetanus vaccines - belives was in 01/2008.  Declined Zostavax due to cost and she has h/o shingles  Colorectal cancer screening - due colonoscopy - referral sent  Diabetes screening - last FBG was slightly elevated - discussed limiting high sugar containing foods  Bone Denisty / Osteoporosis Screening - UTD  Mammogram - UTD  PAP - UTD  Glaucoma screening / Diabetic Eye Exam - UTD  Nutrition counseling - increase non starchy vegetables and llean proteins. Limits high fat foods and salt intake    Advanced Directives - UTD  Physical Activity - increase scheduled exercise - goal is  150 mintues per week.     Patient Instructions (the written plan) were given to the patient.   Cherre Robins, [facility]   03/13/2016

## 2016-03-14 ENCOUNTER — Encounter (INDEPENDENT_AMBULATORY_CARE_PROVIDER_SITE_OTHER): Payer: Self-pay | Admitting: *Deleted

## 2016-04-05 ENCOUNTER — Encounter (INDEPENDENT_AMBULATORY_CARE_PROVIDER_SITE_OTHER): Payer: Self-pay

## 2016-06-05 DIAGNOSIS — Z1231 Encounter for screening mammogram for malignant neoplasm of breast: Secondary | ICD-10-CM | POA: Diagnosis not present

## 2016-08-04 ENCOUNTER — Ambulatory Visit: Payer: Medicare Other | Admitting: Family Medicine

## 2016-08-22 ENCOUNTER — Encounter: Payer: Self-pay | Admitting: Family Medicine

## 2016-08-22 ENCOUNTER — Ambulatory Visit (INDEPENDENT_AMBULATORY_CARE_PROVIDER_SITE_OTHER): Payer: Medicare Other | Admitting: Family Medicine

## 2016-08-22 VITALS — BP 122/76 | HR 68 | Temp 98.1°F | Ht 65.5 in | Wt 165.6 lb

## 2016-08-22 DIAGNOSIS — I10 Essential (primary) hypertension: Secondary | ICD-10-CM | POA: Diagnosis not present

## 2016-08-22 MED ORDER — ATORVASTATIN CALCIUM 40 MG PO TABS: ORAL_TABLET | ORAL | 3 refills | Status: DC

## 2016-08-22 MED ORDER — LISINOPRIL 20 MG PO TABS: ORAL_TABLET | ORAL | 3 refills | Status: DC

## 2016-08-22 NOTE — Progress Notes (Signed)
   Subjective:    Patient ID: Teresa Patel, female    DOB: 1951-10-29, 65 y.o.   MRN: [id number]  HPI 65 year old female here to follow-up hypertension, lipids. She has no complaints or problems. 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 She is up-to-date on mammograms and Pap smears.  Patient Active Problem List   Diagnosis Date Noted  . HTN (hypertension) 11/28/2013  . Need for prophylactic vaccination and inoculation against influenza 11/28/2013  . Hyperlipidemia   . Hypertension   . GERD (gastroesophageal reflux disease)   . Anxiety    Outpatient Encounter Prescriptions as of 08/22/2016  Medication Sig  . atorvastatin (LIPITOR) 40 MG tablet TAKE ONE (1) TABLET EACH DAY  . co-enzyme Q-10 50 MG capsule Take 50 mg by mouth daily.  Marland Kitchen ibuprofen (ADVIL,MOTRIN) 200 MG tablet Take 200 mg by mouth as needed.  Marland Kitchen lisinopril (PRINIVIL,ZESTRIL) 20 MG tablet TAKE ONE (1) TABLET EACH DAY (Patient taking differently: Take 10 mg by mouth daily. TAKE ONE (1) TABLET EACH DAY)  . Nutritional Supplements (GRAPESEED EXTRACT PO) Take by mouth.  . ranitidine (ZANTAC) 75 MG tablet Take 75 mg by mouth daily as needed for heartburn.  . [DISCONTINUED] aspirin 81 MG tablet Take 81 mg by mouth daily. Reported on 03/13/2016   No facility-administered encounter medications on file as of 08/22/2016.       Review of Systems  Constitutional: Negative.   HENT: Negative.   Eyes: Negative.   Respiratory: Negative.   Cardiovascular: Negative.   Gastrointestinal: Negative.   Endocrine: Negative.   Genitourinary: Negative.   Hematological: Negative.   Psychiatric/Behavioral: Negative.        Objective:   Physical Exam  Constitutional: She is oriented to person, place, and time. She appears well-developed and well-nourished.  Cardiovascular: Normal rate, regular rhythm and normal heart sounds.   Pulmonary/Chest: Effort normal and breath sounds normal.  Neurological: She is  alert and oriented to person, place, and time.  Psychiatric: She has a normal mood and affect. Her behavior is normal.   BP 122/76 (BP Location: Left Arm, Patient Position: Sitting, Cuff Size: Normal)   Pulse 68   Temp 98.1 F (36.7 C) (Oral)   Ht 5' 5.5" (1.664 m)   Wt 165 lb 9.6 oz (75.1 kg)   BMI 27.14 kg/m         Assessment & Plan:  1. Essential hypertension Blood pressure is well controlled on current regimen of lisinopril. There are no side effects. She also takes atorvastatin 40 mg ear that's well tolerated and lipids are at goal  Wardell Honour MD - Hepatitis C antibody

## 2016-08-25 ENCOUNTER — Telehealth: Payer: Self-pay | Admitting: Family Medicine

## 2016-08-26 ENCOUNTER — Ambulatory Visit (INDEPENDENT_AMBULATORY_CARE_PROVIDER_SITE_OTHER): Payer: Medicare Other | Admitting: Nurse Practitioner

## 2016-08-26 ENCOUNTER — Encounter: Payer: Self-pay | Admitting: Nurse Practitioner

## 2016-08-26 VITALS — BP 130/88 | HR 93 | Temp 98.0°F | Ht 65.5 in | Wt 165.0 lb

## 2016-08-26 DIAGNOSIS — R300 Dysuria: Secondary | ICD-10-CM

## 2016-08-26 DIAGNOSIS — R509 Fever, unspecified: Secondary | ICD-10-CM

## 2016-08-26 LAB — URINALYSIS
Bilirubin, UA: NEGATIVE
GLUCOSE, UA: NEGATIVE
KETONES UA: NEGATIVE
Nitrite, UA: NEGATIVE
Protein, UA: NEGATIVE
Specific Gravity, UA: 1.02 (ref 1.005–1.030)
UUROB: 0.2 mg/dL (ref 0.2–1.0)
pH, UA: 5.5 (ref 5.0–7.5)

## 2016-08-26 NOTE — Progress Notes (Signed)
Subjective:    MERIKA WYTHE is a 65 y.o. female who complains of dysuria, foul smelling urine and fever of 102 2 nights ago which has resolved. She has had symptoms for 3 days. Patient also complains of headache. Patient denies back pain, congestion, cough, fever, rhinitis, sorethroat, stomach ache, vaginal discharge and frequency or urgency. Patient does have a history of recurrent UTI. Patient does not have a history of pyelonephritis.   The following portions of the patient's history were reviewed and updated as appropriate: allergies, current medications, past family history, past medical history, past social history, past surgical history and problem list.  Review of Systems Pertinent items noted in HPI and remainder of comprehensive ROS otherwise negative.    Objective:    BP 130/88   Pulse 93   Temp 98 F (36.7 C) (Oral)   Ht 5' 5.5" (1.664 m)   Wt 165 lb (74.8 kg)   BMI 27.04 kg/m  General appearance: alert and cooperative Eyes: conjunctivae/corneas clear. PERRL, EOM's intact. Fundi benign. Ears: normal TM's and external ear canals both ears Nose: Nares normal. Septum midline. Mucosa normal. No drainage or sinus tenderness. Throat: lips, mucosa, and tongue normal; teeth and gums normal Neck: no adenopathy, no carotid bruit, no JVD, supple, symmetrical, trachea midline and thyroid not enlarged, symmetric, no tenderness/mass/nodules Lungs: clear to auscultation bilaterally and no cough Heart: regular rate and rhythm, S1, S2 normal, no murmur, click, rub or gallop Abdomen: soft, non-tender; bowel sounds normal; no masses,  no organomegaly  Laboratory:  Urine dipstick: negative for all components.     Assessment:   Viral fever     Plan:    Follow up if symptoms not improving, and as needed.   Force fluids Rest Motrin or tylenol OTC for fever RTO prn  Mary-Margaret Hassell Done, FNP

## 2016-08-31 ENCOUNTER — Other Ambulatory Visit: Payer: Self-pay | Admitting: Nurse Practitioner

## 2016-08-31 LAB — URINE CULTURE

## 2016-08-31 MED ORDER — CIPROFLOXACIN HCL 500 MG PO TABS: 500.0000 mg | ORAL_TABLET | Freq: Two times a day (BID) | ORAL | 0 refills | Status: DC

## 2016-09-12 NOTE — Telephone Encounter (Signed)
Pt will call back

## 2016-10-02 ENCOUNTER — Telehealth: Payer: Self-pay | Admitting: Family Medicine

## 2016-10-03 NOTE — Telephone Encounter (Signed)
Will get at next apt

## 2016-10-06 ENCOUNTER — Ambulatory Visit (INDEPENDENT_AMBULATORY_CARE_PROVIDER_SITE_OTHER): Payer: Medicare Other

## 2016-10-06 DIAGNOSIS — Z23 Encounter for immunization: Secondary | ICD-10-CM

## 2016-12-28 ENCOUNTER — Other Ambulatory Visit (INDEPENDENT_AMBULATORY_CARE_PROVIDER_SITE_OTHER): Payer: Self-pay | Admitting: *Deleted

## 2016-12-28 DIAGNOSIS — Z1211 Encounter for screening for malignant neoplasm of colon: Secondary | ICD-10-CM

## 2017-02-16 ENCOUNTER — Encounter (INDEPENDENT_AMBULATORY_CARE_PROVIDER_SITE_OTHER): Payer: Self-pay | Admitting: *Deleted

## 2017-02-16 ENCOUNTER — Telehealth (INDEPENDENT_AMBULATORY_CARE_PROVIDER_SITE_OTHER): Payer: Self-pay | Admitting: *Deleted

## 2017-02-16 NOTE — Telephone Encounter (Signed)
Patient needs trilyte 

## 2017-02-19 MED ORDER — PEG 3350-KCL-NA BICARB-NACL 420 G PO SOLR: 4000.0000 mL | Freq: Once | ORAL | 0 refills | Status: DC

## 2017-02-20 ENCOUNTER — Telehealth (INDEPENDENT_AMBULATORY_CARE_PROVIDER_SITE_OTHER): Payer: Self-pay | Admitting: *Deleted

## 2017-02-20 ENCOUNTER — Other Ambulatory Visit: Payer: Medicare Other | Admitting: Family Medicine

## 2017-02-20 NOTE — Telephone Encounter (Signed)
Referring MD/PCP: Alain Honey -- [facility]   Procedure: tcs  Reason/Indication:  screening  Has patient had this procedure before?  Yes, 11 yrs ago  If so, when, by whom and where?    Is there a family history of colon cancer?  no  Who?  What age when diagnosed?    Is patient diabetic?   no      Does patient have prosthetic heart valve or mechanical valve?  no  Do you have a pacemaker?  no  Has patient ever had endocarditis? no  Has patient had joint replacement within last 12 months?  no  Does patient tend to be constipated or take laxatives? no  Does patient have a history of alcohol/drug use?  no  Is patient on Coumadin, Plavix and/or Aspirin? no  Medications: lisinopril 10 mg daily, atorvastatin 40 mg 1 tab every other day  Allergies: nkda  Medication Adjustment per Dr Laural Golden:   Procedure date & time: 03/21/17 at 1030

## 2017-02-21 NOTE — Telephone Encounter (Signed)
agree

## 2017-03-08 ENCOUNTER — Other Ambulatory Visit (INDEPENDENT_AMBULATORY_CARE_PROVIDER_SITE_OTHER): Payer: Self-pay | Admitting: Internal Medicine

## 2017-03-21 ENCOUNTER — Encounter (HOSPITAL_COMMUNITY)
Admission: RE | Disposition: A | Discharge status: Home / Self Care | Payer: Self-pay | Source: Ambulatory Visit | Attending: Internal Medicine

## 2017-03-21 ENCOUNTER — Encounter (HOSPITAL_COMMUNITY): Payer: Self-pay | Admitting: *Deleted

## 2017-03-21 ENCOUNTER — Ambulatory Visit (HOSPITAL_COMMUNITY)
Admission: RE | Admit: 2017-03-21 | Discharge: 2017-03-21 | Disposition: A | Discharge status: Home / Self Care | Payer: Medicare Other | Source: Ambulatory Visit | Attending: Internal Medicine | Admitting: Internal Medicine

## 2017-03-21 DIAGNOSIS — Z8249 Family history of ischemic heart disease and other diseases of the circulatory system: Secondary | ICD-10-CM | POA: Diagnosis not present

## 2017-03-21 DIAGNOSIS — Z8042 Family history of malignant neoplasm of prostate: Secondary | ICD-10-CM | POA: Diagnosis not present

## 2017-03-21 DIAGNOSIS — K644 Residual hemorrhoidal skin tags: Secondary | ICD-10-CM | POA: Insufficient documentation

## 2017-03-21 DIAGNOSIS — K6289 Other specified diseases of anus and rectum: Secondary | ICD-10-CM | POA: Insufficient documentation

## 2017-03-21 DIAGNOSIS — D125 Benign neoplasm of sigmoid colon: Secondary | ICD-10-CM | POA: Diagnosis not present

## 2017-03-21 DIAGNOSIS — K219 Gastro-esophageal reflux disease without esophagitis: Secondary | ICD-10-CM | POA: Insufficient documentation

## 2017-03-21 DIAGNOSIS — Z800 Family history of malignant neoplasm of digestive organs: Secondary | ICD-10-CM | POA: Insufficient documentation

## 2017-03-21 DIAGNOSIS — Z79899 Other long term (current) drug therapy: Secondary | ICD-10-CM | POA: Diagnosis not present

## 2017-03-21 DIAGNOSIS — Z9889 Other specified postprocedural states: Secondary | ICD-10-CM | POA: Insufficient documentation

## 2017-03-21 DIAGNOSIS — D122 Benign neoplasm of ascending colon: Secondary | ICD-10-CM | POA: Insufficient documentation

## 2017-03-21 DIAGNOSIS — Z1211 Encounter for screening for malignant neoplasm of colon: Secondary | ICD-10-CM | POA: Diagnosis not present

## 2017-03-21 DIAGNOSIS — I10 Essential (primary) hypertension: Secondary | ICD-10-CM | POA: Insufficient documentation

## 2017-03-21 DIAGNOSIS — Z90710 Acquired absence of both cervix and uterus: Secondary | ICD-10-CM | POA: Diagnosis not present

## 2017-03-21 DIAGNOSIS — E785 Hyperlipidemia, unspecified: Secondary | ICD-10-CM | POA: Diagnosis not present

## 2017-03-21 DIAGNOSIS — Z8619 Personal history of other infectious and parasitic diseases: Secondary | ICD-10-CM | POA: Diagnosis not present

## 2017-03-21 DIAGNOSIS — Z791 Long term (current) use of non-steroidal anti-inflammatories (NSAID): Secondary | ICD-10-CM | POA: Diagnosis not present

## 2017-03-21 DIAGNOSIS — D124 Benign neoplasm of descending colon: Secondary | ICD-10-CM | POA: Diagnosis not present

## 2017-03-21 DIAGNOSIS — K5289 Other specified noninfective gastroenteritis and colitis: Secondary | ICD-10-CM | POA: Diagnosis not present

## 2017-03-21 DIAGNOSIS — K635 Polyp of colon: Secondary | ICD-10-CM | POA: Diagnosis not present

## 2017-03-21 HISTORY — PX: COLONOSCOPY: SHX5424

## 2017-03-21 HISTORY — PX: POLYPECTOMY: SHX5525

## 2017-03-21 SURGERY — COLONOSCOPY
Anesthesia: Moderate Sedation

## 2017-03-21 MED ORDER — MEPERIDINE HCL 50 MG/ML IJ SOLN
INTRAMUSCULAR | Status: DC | PRN
  Administered 2017-03-21 (×2): 25 mg via INTRAVENOUS

## 2017-03-21 MED ORDER — SODIUM CHLORIDE 0.9 % IV SOLN
INTRAVENOUS | Status: DC
  Administered 2017-03-21: 10:00:00 via INTRAVENOUS

## 2017-03-21 MED ORDER — MEPERIDINE HCL 50 MG/ML IJ SOLN
INTRAMUSCULAR | Status: AC
  Filled 2017-03-21: qty 1

## 2017-03-21 MED ORDER — MIDAZOLAM HCL 5 MG/5ML IJ SOLN
INTRAMUSCULAR | Status: DC | PRN
  Administered 2017-03-21 (×2): 2 mg via INTRAVENOUS
  Administered 2017-03-21: 1 mg via INTRAVENOUS

## 2017-03-21 MED ORDER — MIDAZOLAM HCL 5 MG/5ML IJ SOLN
INTRAMUSCULAR | Status: DC
Start: 2017-03-21 — End: 2017-03-21
  Filled 2017-03-21: qty 10

## 2017-03-21 MED ORDER — SIMETHICONE 40 MG/0.6ML PO SUSP
ORAL | Status: DC | PRN
  Administered 2017-03-21: 10:00:00

## 2017-03-21 NOTE — Op Note (Signed)
[facility] Patient Name: Teresa Patel Procedure Date: 03/21/2017 9:41 AM MRN: [id number] Date of Birth: June 23, 1951 Attending MD: Hildred Laser , MD CSN: [id number] Age: 66 Admit Type: Outpatient Procedure:                Colonoscopy Indications:              Screening for colorectal malignant neoplasm Providers:                Hildred Laser, MD, Otis Peak B. Sharon Seller, RN, Zoila Shutter, Technologist Referring MD:             Fransisca Kaufmann. Dettinger, MD Medicines:                Meperidine 50 mg IV, Midazolam 5 mg IV Complications:            No immediate complications. Estimated Blood Loss:     Estimated blood loss was minimal. Procedure:                Pre-Anesthesia Assessment:                           - Prior to the procedure, a History and Physical                            was performed, and patient medications and                            allergies were reviewed. The patient's tolerance of                            previous anesthesia was also reviewed. The risks                            and benefits of the procedure and the sedation                            options and risks were discussed with the patient.                            All questions were answered, and informed consent                            was obtained. Prior Anticoagulants: The patient                            last took ibuprofen 3 days prior to the procedure.                            ASA Grade Assessment: II - A patient with mild                            systemic disease. After reviewing the risks and  benefits, the patient was deemed in satisfactory                            condition to undergo the procedure.                           After obtaining informed consent, the colonoscope                            was passed under direct vision. Throughout the                            procedure, the patient's blood pressure, pulse, and                       oxygen saturations were monitored continuously. The                            [id number] ([id number]) scope was introduced through                            the anus and advanced to the the cecum, identified                            by appendiceal orifice and ileocecal valve. The                            colonoscopy was performed without difficulty. The                            patient tolerated the procedure well. The quality                            of the bowel preparation was excellent. The                            ileocecal valve, appendiceal orifice, and rectum                            were photographed. Scope In: 10:04:46 AM Scope Out: 10:24:18 AM Scope Withdrawal Time: 0 hours 10 minutes 2 seconds  Total Procedure Duration: 0 hours 19 minutes 32 seconds  Findings:      The perianal exam findings include skin tags.      Three sessile polyps were found in the sigmoid colon, descending colon       and ascending colon. The polyps were small in size. These were biopsied       with a cold forceps for histology. The pathology specimen was placed       into Bottle Number 1.      The exam was otherwise without abnormality.      External hemorrhoids were found during retroflexion. The hemorrhoids       were small.      Anal papilla(e) were hypertrophied. Impression:               - Perianal skin tags found on perianal exam.                           -  Three small polyps in the sigmoid colon, in the                            descending colon and in the ascending colon.                            Biopsied.                           - The examination was otherwise normal.                           - External hemorrhoids.                           - Anal papilla(e) were hypertrophied. Moderate Sedation:      Moderate (conscious) sedation was administered by the endoscopy nurse       and supervised by the endoscopist. The following parameters were        monitored: oxygen saturation, heart rate, blood pressure, CO2       capnography and response to care. Total physician intraservice time was       25 minutes. Recommendation:           - Patient has a contact number available for                            emergencies. The signs and symptoms of potential                            delayed complications were discussed with the                            patient. Return to normal activities tomorrow.                            Written discharge instructions were provided to the                            patient.                           - Resume previous diet today.                           - Continue present medications.                           - Await pathology results.                           - Repeat colonoscopy date to be determined after                            pending pathology results are reviewed. Procedure Code(s):        --- Professional ---  71245, Colonoscopy, flexible; with biopsy, single                            or multiple                           99152, Moderate sedation services provided by the                            same physician or other qualified health care                            professional performing the diagnostic or                            therapeutic service that the sedation supports,                            requiring the presence of an independent trained                            observer to assist in the monitoring of the                            patient's level of consciousness and physiological                            status; initial 15 minutes of intraservice time,                            patient age 70 years or older                           [phone], Moderate sedation services; each additional                            15 minutes intraservice time Diagnosis Code(s):        --- Professional ---                           K64.4, Residual  hemorrhoidal skin tags                           Z12.11, Encounter for screening for malignant                            neoplasm of colon                           D12.5, Benign neoplasm of sigmoid colon                           D12.4, Benign neoplasm of descending colon                           D12.2, Benign neoplasm of ascending  colon                           K62.89, Other specified diseases of anus and rectum CPT copyright 2016 American Medical Association. All rights reserved. The codes documented in this report are preliminary and upon coder review may  be revised to meet current compliance requirements. Hildred Laser, MD Hildred Laser, MD 03/21/2017 10:31:30 AM This report has been signed electronically. Number of Addenda: 0

## 2017-03-21 NOTE — H&P (Signed)
Teresa Patel is an 66 y.o. female.   Chief Complaint: Patient is here for colonoscopy. HPI: Patient is 66 year old Caucasian female who is here for screening colonoscopy. Last exam was in 2007. She denies abdominal pain change in bowel habits or rectal bleeding. Family history is negative for CRC.  Past Medical History:  Diagnosis Date  . Anxiety   . GERD (gastroesophageal reflux disease)   . Hyperlipidemia   . Hypertension   . Shingles     Past Surgical History:  Procedure Laterality Date  . ABDOMINAL HYSTERECTOMY    . carpal tunnel Bilateral     Family History  Problem Relation Age of Onset  . Heart disease Mother   . Heart disease Father   . Cancer Father     prostate  . Cancer Brother     pancreatic  . Diabetes Brother   . Heart disease Brother   . Cancer Brother     prostate  . Heart disease Brother   . Colon cancer Neg Hx    Social History:  reports that she has never smoked. She has never used smokeless tobacco. She reports that she does not drink alcohol or use drugs.  Allergies: No Known Allergies  Medications Prior to Admission  Medication Sig Dispense Refill  . atorvastatin (LIPITOR) 40 MG tablet TAKE ONE (1) TABLET EACH DAY (Patient taking differently: Take 40 mg by mouth every other day. ) 90 tablet 3  . co-enzyme Q-10 50 MG capsule Take 50 mg by mouth daily.    Marland Kitchen GAVILYTE-N WITH FLAVOR PACK 420 g solution TAKE AS DIRECTED 4000 mL 0  . ibuprofen (ADVIL,MOTRIN) 200 MG tablet Take 200 mg by mouth as needed for moderate pain.     Marland Kitchen lisinopril (PRINIVIL,ZESTRIL) 20 MG tablet TAKE ONE (1) TABLET EACH DAY (Patient taking differently: Take 10 mg by mouth daily. ) 90 tablet 3  . loratadine (CLARITIN) 10 MG tablet Take 10 mg by mouth daily as needed for allergies.    Marland Kitchen MAGNESIUM PO Take 1 tablet by mouth daily.    . Melatonin 10 MG TABS Take 10 mg by mouth at bedtime as needed (sleep).    . Multiple Vitamin (MULTIVITAMIN WITH MINERALS) TABS tablet Take 1 tablet  by mouth daily.    . Nutritional Supplements (GRAPESEED EXTRACT PO) Take 2 capsules by mouth daily.     . ranitidine (ZANTAC) 75 MG tablet Take 75 mg by mouth daily as needed for heartburn.    . sodium chloride (OCEAN) 0.65 % SOLN nasal spray Place 1 spray into both nostrils as needed for congestion.      No results found for this or any previous visit (from the past 48 hour(s)). No results found.  ROS  Blood pressure 124/70, pulse 72, temperature 98.6 F (37 C), temperature source Oral, resp. rate (!) 21, height 5' 5.5" (1.664 m), weight 168 lb (76.2 kg), SpO2 100 %. Physical Exam  Constitutional: She appears well-developed and well-nourished.  HENT:  Mouth/Throat: Oropharynx is clear and moist.  Eyes: Conjunctivae are normal. No scleral icterus.  Neck: No thyromegaly present.  Cardiovascular: Normal rate, regular rhythm and normal heart sounds.   No murmur heard. Respiratory: Effort normal and breath sounds normal.  GI: Soft. She exhibits no distension and no mass. There is no tenderness.  Musculoskeletal: She exhibits no edema.  Lymphadenopathy:    She has no cervical adenopathy.  Neurological: She is alert.  Skin: Skin is warm and dry.  Assessment/Plan Average risk screening colonoscopy.  Hildred Laser, MD 03/21/2017, 9:56 AM

## 2017-03-21 NOTE — Discharge Instructions (Signed)
Resume usual medications and diet. No driving for 24 hours. Physician will call with biopsy results.        Colonoscopy, Adult, Care After This sheet gives you information about how to care for yourself after your procedure. Your doctor may also give you more specific instructions. If you have problems or questions, call your doctor. Follow these instructions at home: General instructions    For the first 24 hours after the procedure:  Do not drive or use machinery.  Do not sign important documents.  Do not drink alcohol.  Do your daily activities more slowly than normal.  Eat foods that are soft and easy to digest.  Rest often.  Take over-the-counter or prescription medicines only as told by your doctor.  It is up to you to get the results of your procedure. Ask your doctor, or the department performing the procedure, when your results will be ready. To help cramping and bloating:   Try walking around.  Put heat on your belly (abdomen) as told by your doctor. Use a heat source that your doctor recommends, such as a moist heat pack or a heating pad.  Put a towel between your skin and the heat source.  Leave the heat on for 20-30 minutes.  Remove the heat if your skin turns bright red. This is especially important if you cannot feel pain, heat, or cold. You can get burned. Eating and drinking   Drink enough fluid to keep your pee (urine) clear or pale yellow.  Return to your normal diet as told by your doctor. Avoid heavy or fried foods that are hard to digest.  Avoid drinking alcohol for as long as told by your doctor. Contact a doctor if:  You have blood in your poop (stool) 2-3 days after the procedure. Get help right away if:  You have more than a small amount of blood in your poop.  You see large clumps of tissue (blood clots) in your poop.  Your belly is swollen.  You feel sick to your stomach (nauseous).  You throw up (vomit).  You have a  fever.  You have belly pain that gets worse, and medicine does not help your pain. This information is not intended to replace advice given to you by your health care provider. Make sure you discuss any questions you have with your health care provider. Document Released: 01/06/2011 Document Revised: 08/28/2016 Document Reviewed: 08/28/2016 Elsevier Interactive Patient Education  2017 [place].    Colon Polyps Polyps are tissue growths inside the body. Polyps can grow in many places, including the large intestine (colon). A polyp may be a round bump or a mushroom-shaped growth. You could have one polyp or several. Most colon polyps are noncancerous (benign). However, some colon polyps can become cancerous over time. What are the causes? The exact cause of colon polyps is not known. What increases the risk? This condition is more likely to develop in people who:  Have a family history of colon cancer or colon polyps.  Are older than 10 or older than 45 if they are African American.  Have inflammatory bowel disease, such as ulcerative colitis or Crohn disease.  Are overweight.  Smoke cigarettes.  Do not get enough exercise.  Drink too much alcohol.  Eat a diet that is:  High in fat and red meat.  Low in fiber.  Had childhood cancer that was treated with abdominal radiation. What are the signs or symptoms? Most polyps do not cause symptoms.  If you have symptoms, they may include:  Blood coming from your rectum when having a bowel movement.  Blood in your stool.The stool may look dark red or black.  A change in bowel habits, such as constipation or diarrhea. How is this diagnosed? This condition is diagnosed with a colonoscopy. This is a procedure that uses a lighted, flexible scope to look at the inside of your colon. How is this treated? Treatment for this condition involves removing any polyps that are found. Those polyps will then be tested for cancer. If cancer  is found, your health care provider will talk to you about options for colon cancer treatment. Follow these instructions at home: Diet   Eat plenty of fiber, such as fruits, vegetables, and whole grains.  Eat foods that are high in calcium and vitamin D, such as milk, cheese, yogurt, eggs, liver, fish, and broccoli.  Limit foods high in fat, red meats, and processed meats, such as hot dogs, sausage, bacon, and lunch meats.  Maintain a healthy weight, or lose weight if recommended by your health care provider. General instructions   Do not smoke cigarettes.  Do not drink alcohol excessively.  Keep all follow-up visits as told by your health care provider. This is important. This includes keeping regularly scheduled colonoscopies. Talk to your health care provider about when you need a colonoscopy.  Exercise every day or as told by your health care provider. Contact a health care provider if:  You have new or worsening bleeding during a bowel movement.  You have new or increased blood in your stool.  You have a change in bowel habits.  You unexpectedly lose weight. This information is not intended to replace advice given to you by your health care provider. Make sure you discuss any questions you have with your health care provider. Document Released: 08/30/2004 Document Revised: 05/11/2016 Document Reviewed: 10/25/2015 Elsevier Interactive Patient Education  2017 [place].    Hemorrhoids Hemorrhoids are swollen veins in and around the rectum or anus. There are two types of hemorrhoids:  Internal hemorrhoids. These occur in the veins that are just inside the rectum. They may poke through to the outside and become irritated and painful.  External hemorrhoids. These occur in the veins that are outside of the anus and can be felt as a painful swelling or hard lump near the anus. Most hemorrhoids do not cause serious problems, and they can be managed with home treatments such  as diet and lifestyle changes. If home treatments do not help your symptoms, procedures can be done to shrink or remove the hemorrhoids. What are the causes? This condition is caused by increased pressure in the anal area. This pressure may result from various things, including:  Constipation.  Straining to have a bowel movement.  Diarrhea.  Pregnancy.  Obesity.  Sitting for long periods of time.  Heavy lifting or other activity that causes you to strain.  Anal sex. What are the signs or symptoms? Symptoms of this condition include:  Pain.  Anal itching or irritation.  Rectal bleeding.  Leakage of stool (feces).  Anal swelling.  One or more lumps around the anus. How is this diagnosed? This condition can often be diagnosed through a visual exam. Other exams or tests may also be done, such as:  Examination of the rectal area with a gloved hand (digital rectal exam).  Examination of the anal canal using a small tube (anoscope).  A blood test, if you have lost a  significant amount of blood.  A test to look inside the colon (sigmoidoscopy or colonoscopy). How is this treated? This condition can usually be treated at home. However, various procedures may be done if dietary changes, lifestyle changes, and other home treatments do not help your symptoms. These procedures can help make the hemorrhoids smaller or remove them completely. Some of these procedures involve surgery, and others do not. Common procedures include:  Rubber band ligation. Rubber bands are placed at the base of the hemorrhoids to cut off the blood supply to them.  Sclerotherapy. Medicine is injected into the hemorrhoids to shrink them.  Infrared coagulation. A type of light energy is used to get rid of the hemorrhoids.  Hemorrhoidectomy surgery. The hemorrhoids are surgically removed, and the veins that supply them are tied off.  Stapled hemorrhoidopexy surgery. A circular stapling device is used to  remove the hemorrhoids and use staples to cut off the blood supply to them. Follow these instructions at home: Eating and drinking   Eat foods that have a lot of fiber in them, such as whole grains, beans, nuts, fruits, and vegetables. Ask your health care provider about taking products that have added fiber (fiber supplements).  Drink enough fluid to keep your urine clear or pale yellow. Managing pain and swelling   Take warm sitz baths for 20 minutes, 3-4 times a day to ease pain and discomfort.  If directed, apply ice to the affected area. Using ice packs between sitz baths may be helpful.  Put ice in a plastic bag.  Place a towel between your skin and the bag.  Leave the ice on for 20 minutes, 2-3 times a day. General instructions   Take over-the-counter and prescription medicines only as told by your health care provider.  Use medicated creams or suppositories as told.  Exercise regularly.  Go to the bathroom when you have the urge to have a bowel movement. Do not wait.  Avoid straining to have bowel movements.  Keep the anal area dry and clean. Use wet toilet paper or moist towelettes after a bowel movement.  Do not sit on the toilet for long periods of time. This increases blood pooling and pain. Contact a health care provider if:  You have increasing pain and swelling that are not controlled by treatment or medicine.  You have uncontrolled bleeding.  You have difficulty having a bowel movement, or you are unable to have a bowel movement.  You have pain or inflammation outside the area of the hemorrhoids. This information is not intended to replace advice given to you by your health care provider. Make sure you discuss any questions you have with your health care provider. Document Released: 12/01/2000 Document Revised: 05/03/2016 Document Reviewed: 08/18/2015 Elsevier Interactive Patient Education  2017 [employer].

## 2017-03-26 ENCOUNTER — Encounter (HOSPITAL_COMMUNITY): Payer: Self-pay | Admitting: Internal Medicine

## 2017-04-11 ENCOUNTER — Encounter: Payer: Self-pay | Admitting: Family Medicine

## 2017-04-11 ENCOUNTER — Ambulatory Visit (INDEPENDENT_AMBULATORY_CARE_PROVIDER_SITE_OTHER): Payer: Medicare Other | Admitting: Family Medicine

## 2017-04-11 VITALS — BP 130/78 | HR 76 | Temp 98.4°F | Ht 65.5 in | Wt 165.5 lb

## 2017-04-11 DIAGNOSIS — Z1159 Encounter for screening for other viral diseases: Secondary | ICD-10-CM

## 2017-04-11 DIAGNOSIS — H6121 Impacted cerumen, right ear: Secondary | ICD-10-CM

## 2017-04-11 DIAGNOSIS — E782 Mixed hyperlipidemia: Secondary | ICD-10-CM | POA: Diagnosis not present

## 2017-04-11 DIAGNOSIS — I10 Essential (primary) hypertension: Secondary | ICD-10-CM | POA: Diagnosis not present

## 2017-04-11 DIAGNOSIS — Z23 Encounter for immunization: Secondary | ICD-10-CM

## 2017-04-11 DIAGNOSIS — R7309 Other abnormal glucose: Secondary | ICD-10-CM

## 2017-04-11 MED ORDER — ATORVASTATIN CALCIUM 40 MG PO TABS: ORAL_TABLET | ORAL | 1 refills | Status: DC

## 2017-04-11 MED ORDER — LISINOPRIL 20 MG PO TABS: ORAL_TABLET | ORAL | 1 refills | Status: DC

## 2017-04-11 NOTE — Progress Notes (Signed)
BP 130/78   Pulse 76   Temp 98.4 F (36.9 C) (Oral)   Ht 5' 5.5" (1.664 m)   Wt 165 lb 8 oz (75.1 kg)   BMI 27.12 kg/m    Subjective:    Patient ID: Teresa Patel, female    DOB: 12-06-1951, 66 y.o.   MRN: [id number]  HPI: Teresa Patel is a 66 y.o. female presenting on 04/11/2017 for Annual Exam (has mammogram every 2 years, pap every other year at [facility])   HPI Hypertension recheck Patient is coming in for a hypertension recheck today and labs. Her blood pressure today is 130/78. She is currently on lisinopril for her blood pressure. She denies any lightheadedness or dizziness. Patient denies headaches, blurred vision, chest pains, shortness of breath, or weakness. Denies any side effects from medication and is content with current medication.   Hyperlipidemia Patient is coming in for recheck of his hyperlipidemia. He is currently taking Lipitor. He denies any issues with myalgias or history of liver damage from it. He denies any focal numbness or weakness or chest pain.   Right ear muffled Patient has issues with her right ear being muffled and having wax in it and then occasionally she'll get this shooting pain down from her right ear into her right jaw which is very brief and then passes. She says she's tried multiple home remedies such as hydroperoxide and hot mineral oil but they do not seem to be helping. She feels like she can't hear as well out of that ear because of this. She denies any fevers or chills or congestion or sinus issues.  Relevant past medical, surgical, family and social history reviewed and updated as indicated. Interim medical history since our last visit reviewed. Allergies and medications reviewed and updated.  Review of Systems  Constitutional: Negative for chills and fever.  HENT: Positive for ear pain. Negative for ear discharge.   Respiratory: Negative for chest tightness and shortness of breath.   Cardiovascular: Negative for chest pain and  leg swelling.  Genitourinary: Negative for difficulty urinating and dysuria.  Musculoskeletal: Negative for back pain and gait problem.  Skin: Negative for rash.  Neurological: Negative for light-headedness and headaches.  Psychiatric/Behavioral: Negative for agitation and behavioral problems.  All other systems reviewed and are negative.   Per HPI unless specifically indicated above     Objective:    BP 130/78   Pulse 76   Temp 98.4 F (36.9 C) (Oral)   Ht 5' 5.5" (1.664 m)   Wt 165 lb 8 oz (75.1 kg)   BMI 27.12 kg/m   Wt Readings from Last 3 Encounters:  04/11/17 165 lb 8 oz (75.1 kg)  03/21/17 168 lb (76.2 kg)  08/26/16 165 lb (74.8 kg)    Physical Exam  Constitutional: She is oriented to person, place, and time. She appears well-developed and well-nourished. No distress.  HENT:  Right Ear: External ear normal.  Left Ear: External ear normal.  Nose: Nose normal.  Mouth/Throat: Oropharynx is clear and moist.  Cerumen impaction in right ear canal, left ear is clear  Eyes: Conjunctivae are normal.  Neck: Neck supple. No thyromegaly present.  Cardiovascular: Normal rate, regular rhythm, normal heart sounds and intact distal pulses.   No murmur heard. Pulmonary/Chest: Effort normal and breath sounds normal. No respiratory distress. She has no wheezes.  Musculoskeletal: Normal range of motion. She exhibits no edema or tenderness.  Lymphadenopathy:    She has no cervical adenopathy.  Neurological: She is alert and oriented to person, place, and time. Coordination normal.  Skin: Skin is warm and dry. No rash noted. She is not diaphoretic.  Psychiatric: She has a normal mood and affect. Her behavior is normal.  Nursing note and vitals reviewed.  Cerumen disimpaction: Nurse to lavage, able to remove without any issues  After exam with back and looked at ears and they are clear and had no sign of infection.    Assessment & Plan:   Problem List Items Addressed This Visit       Cardiovascular and Mediastinum   Hypertension - Primary   Relevant Medications   lisinopril (PRINIVIL,ZESTRIL) 20 MG tablet   atorvastatin (LIPITOR) 40 MG tablet   Other Relevant Orders   CMP14+EGFR     Other   Hyperlipidemia   Relevant Medications   lisinopril (PRINIVIL,ZESTRIL) 20 MG tablet   atorvastatin (LIPITOR) 40 MG tablet   Other Relevant Orders   Lipid panel    Other Visit Diagnoses    Need for hepatitis C screening test       Relevant Orders   Hepatitis C antibody   Impacted cerumen of right ear           Follow up plan: Return in about 1 year (around 04/11/2018), or if symptoms worsen or fail to improve.  Counseling provided for all of the vaccine components Orders Placed This Encounter  Procedures  . CMP14+EGFR  . Lipid panel  . Hepatitis C antibody    Caryl Pina, MD [facility] 04/11/2017, 10:58 AM

## 2017-04-11 NOTE — Addendum Note (Signed)
Addended by: Michaela Corner on: 04/11/2017 11:52 AM   Modules accepted: Orders

## 2017-04-12 LAB — CMP14+EGFR
A/G RATIO: 1.3 (ref 1.2–2.2)
ALK PHOS: 79 IU/L (ref 39–117)
ALT: 21 IU/L (ref 0–32)
AST: 24 IU/L (ref 0–40)
Albumin: 4.3 g/dL (ref 3.6–4.8)
BUN / CREAT RATIO: 19 (ref 12–28)
BUN: 16 mg/dL (ref 8–27)
Bilirubin Total: 0.5 mg/dL (ref 0.0–1.2)
CALCIUM: 10.1 mg/dL (ref 8.7–10.3)
CO2: 23 mmol/L (ref 18–29)
Chloride: 101 mmol/L (ref 96–106)
Creatinine, Ser: 0.85 mg/dL (ref 0.57–1.00)
GFR calc Af Amer: 82 mL/min/{1.73_m2} (ref >59)
GFR, EST NON AFRICAN AMERICAN: 71 mL/min/{1.73_m2} (ref >59)
Globulin, Total: 3.2 g/dL (ref 1.5–4.5)
Glucose: 100 mg/dL — ABNORMAL HIGH (ref 65–99)
POTASSIUM: 4.6 mmol/L (ref 3.5–5.2)
SODIUM: 141 mmol/L (ref 134–144)
Total Protein: 7.5 g/dL (ref 6.0–8.5)

## 2017-04-12 LAB — LIPID PANEL
CHOL/HDL RATIO: 4.4 ratio (ref 0.0–4.4)
Cholesterol, Total: 203 mg/dL — ABNORMAL HIGH (ref 100–199)
HDL: 46 mg/dL (ref >39)
LDL Calculated: 107 mg/dL — ABNORMAL HIGH (ref 0–99)
TRIGLYCERIDES: 251 mg/dL — AB (ref 0–149)
VLDL Cholesterol Cal: 50 mg/dL — ABNORMAL HIGH (ref 5–40)

## 2017-04-12 LAB — HEPATITIS C ANTIBODY

## 2017-04-13 ENCOUNTER — Other Ambulatory Visit: Payer: Self-pay | Admitting: *Deleted

## 2017-04-13 DIAGNOSIS — R7309 Other abnormal glucose: Secondary | ICD-10-CM | POA: Diagnosis not present

## 2017-04-13 LAB — BAYER DCA HB A1C WAIVED: HB A1C: 5.5 % (ref <7.0)

## 2017-04-13 NOTE — Addendum Note (Signed)
Addended by: Liliane Bade on: 04/13/2017 11:47 AM   Modules accepted: Orders

## 2017-06-07 DIAGNOSIS — Z1231 Encounter for screening mammogram for malignant neoplasm of breast: Secondary | ICD-10-CM | POA: Diagnosis not present

## 2017-08-27 ENCOUNTER — Telehealth: Payer: Self-pay | Admitting: Family Medicine

## 2017-08-27 NOTE — Telephone Encounter (Signed)
I prefer her to be seen an appointment but if she can't or we can get her and then she can go ahead and leave a urine and run a culture and urinalysis

## 2017-08-29 ENCOUNTER — Encounter: Payer: Self-pay | Admitting: Family Medicine

## 2017-08-29 ENCOUNTER — Ambulatory Visit (INDEPENDENT_AMBULATORY_CARE_PROVIDER_SITE_OTHER): Payer: Medicare Other | Admitting: Family Medicine

## 2017-08-29 VITALS — BP 132/83 | HR 76 | Temp 97.3°F | Ht 65.5 in | Wt 165.2 lb

## 2017-08-29 DIAGNOSIS — N3000 Acute cystitis without hematuria: Secondary | ICD-10-CM | POA: Diagnosis not present

## 2017-08-29 DIAGNOSIS — R300 Dysuria: Secondary | ICD-10-CM | POA: Diagnosis not present

## 2017-08-29 LAB — URINALYSIS, COMPLETE
Bilirubin, UA: NEGATIVE
GLUCOSE, UA: NEGATIVE
Ketones, UA: NEGATIVE
NITRITE UA: NEGATIVE
PH UA: 5.5 (ref 5.0–7.5)
Protein, UA: NEGATIVE
RBC, UA: NEGATIVE
Specific Gravity, UA: <1.005 — ABNORMAL LOW (ref 1.005–1.030)
Urobilinogen, Ur: 0.2 mg/dL (ref 0.2–1.0)

## 2017-08-29 LAB — MICROSCOPIC EXAMINATION: RENAL EPITHEL UA: NONE SEEN /HPF

## 2017-08-29 MED ORDER — SULFAMETHOXAZOLE-TRIMETHOPRIM 800-160 MG PO TABS: 1.0000 | ORAL_TABLET | Freq: Two times a day (BID) | ORAL | 0 refills | Status: DC

## 2017-08-29 NOTE — Progress Notes (Signed)
BP 132/83   Pulse 76   Temp (!) 97.3 F (36.3 C) (Oral)   Ht 5' 5.5" (1.664 m)   Wt 165 lb 3.2 oz (74.9 kg)   BMI 27.07 kg/m    Subjective:    Patient ID: Teresa Patel, female    DOB: 1951-09-04, 66 y.o.   MRN: [id number]  HPI: Teresa Patel is a 66 y.o. female presenting on 08/29/2017 for Dysuria (x 6 days) and Fever (Sunday night 100.8)   HPI Dysuria and fever and burning Patient has been having dysuria and burning sensation and frequency that's been going on for the past 6 days. About 5 days ago she did have a fever 100.8 and has felt feverish since then. She denies any abdominal pain or flank pain or back pain. She says mostly it's burning in pain at the introitus. She denies any blood in her urine that she knows of.  Relevant past medical, surgical, family and social history reviewed and updated as indicated. Interim medical history since our last visit reviewed. Allergies and medications reviewed and updated.  Review of Systems  Constitutional: Negative for chills and fever.  Eyes: Negative for visual disturbance.  Respiratory: Negative for chest tightness and shortness of breath.   Cardiovascular: Negative for chest pain and leg swelling.  Gastrointestinal: Negative for abdominal pain.  Genitourinary: Positive for dysuria, frequency and urgency. Negative for difficulty urinating, flank pain, vaginal bleeding, vaginal discharge and vaginal pain.  Musculoskeletal: Negative for back pain and gait problem.  Skin: Negative for rash.  Neurological: Negative for light-headedness and headaches.  Psychiatric/Behavioral: Negative for agitation and behavioral problems.  All other systems reviewed and are negative.   Per HPI unless specifically indicated above        Objective:    BP 132/83   Pulse 76   Temp (!) 97.3 F (36.3 C) (Oral)   Ht 5' 5.5" (1.664 m)   Wt 165 lb 3.2 oz (74.9 kg)   BMI 27.07 kg/m   Wt Readings from Last 3 Encounters:  08/29/17 165 lb 3.2 oz  (74.9 kg)  04/11/17 165 lb 8 oz (75.1 kg)  03/21/17 168 lb (76.2 kg)    Physical Exam  Constitutional: She is oriented to person, place, and time. She appears well-developed and well-nourished. No distress.  Eyes: Conjunctivae are normal.  Cardiovascular: Normal rate, regular rhythm, normal heart sounds and intact distal pulses.   No murmur heard. Pulmonary/Chest: Effort normal and breath sounds normal. No respiratory distress. She has no wheezes. She has no rales.  Abdominal: Soft. Bowel sounds are normal. She exhibits no distension. There is no tenderness. There is no rebound and no guarding.  Musculoskeletal: Normal range of motion.  Neurological: She is alert and oriented to person, place, and time. Coordination normal.  Skin: Skin is warm and dry. No rash noted. She is not diaphoretic.  Psychiatric: She has a normal mood and affect. Her behavior is normal.  Nursing note and vitals reviewed.  Urinalysis: 6-10 WBCs, 0-2 RBCs, few bacteria    Assessment & Plan:   Problem List Items Addressed This Visit    None    Visit Diagnoses    Acute cystitis without hematuria    -  Primary   Relevant Medications   sulfamethoxazole-trimethoprim (BACTRIM DS,SEPTRA DS) 800-160 MG tablet   Other Relevant Orders   Urinalysis, Complete       Follow up plan: Return if symptoms worsen or fail to improve.  Counseling provided for all  of the vaccine components Orders Placed This Encounter  Procedures  . Urinalysis, Complete    Caryl Pina, MD [facility] 08/29/2017, 3:45 PM

## 2017-08-31 NOTE — Telephone Encounter (Signed)
Patient was seen for appointment on 08/29/2017

## 2017-10-15 ENCOUNTER — Ambulatory Visit (INDEPENDENT_AMBULATORY_CARE_PROVIDER_SITE_OTHER): Payer: Medicare Other | Admitting: *Deleted

## 2017-10-15 VITALS — BP 132/77 | HR 61 | Temp 97.3°F | Ht 65.5 in | Wt 165.0 lb

## 2017-10-15 DIAGNOSIS — Z23 Encounter for immunization: Secondary | ICD-10-CM | POA: Diagnosis not present

## 2017-10-15 DIAGNOSIS — Z0000 Encounter for general adult medical examination without abnormal findings: Secondary | ICD-10-CM | POA: Diagnosis not present

## 2017-10-15 NOTE — Progress Notes (Addendum)
Subjective:   Teresa Patel is a 66 y.o. female who presents for Medicare Annual (Subsequent) preventive examination. 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 She enjoys reading, traveling and spending time with her grandchildren. She walks regularly and she states she tends to make healthy eating choices. She attends [employer] regularly. She lives with her husband and she has 3 daughters who also live locally. She currently does not have any pets. She is aware of fall hazards and states that health is about the same as it was a year ago.        Objective:     Vitals: BP 132/77 (BP Location: Left Arm)   Pulse 61   Temp (!) 97.3 F (36.3 C) (Oral)   Ht 5' 5.5" (1.664 m)   Wt 165 lb (74.8 kg)   BMI 27.04 kg/m   Body mass index is 27.04 kg/m.   Tobacco History  Smoking Status  . Passive Smoke Exposure - Never Smoker  Smokeless Tobacco  . Never Used     Counseling given: Not Answered she has never smoked  Past Medical History:  Diagnosis Date  . Anxiety   . GERD (gastroesophageal reflux disease)   . Hyperlipidemia   . Hypertension   . Shingles    Past Surgical History:  Procedure Laterality Date  . ABDOMINAL HYSTERECTOMY    . carpal tunnel Bilateral   . COLONOSCOPY N/A 03/21/2017   Procedure: COLONOSCOPY;  Surgeon: Rogene Houston, MD;  [facility];  Service: Endoscopy;  Laterality: N/A;  1030  . POLYPECTOMY  03/21/2017   Procedure: POLYPECTOMY;  Surgeon: Rogene Houston, MD;  [facility];  Service: Endoscopy;;  colon   Family History  Problem Relation Age of Onset  . Heart disease Mother   . Heart disease Father   . Cancer Father        prostate  . Cancer Brother        pancreatic  . Heart disease Brother        heart attack  . Heart disease Brother   . Diabetes Brother   . Kidney disease Brother   . Cancer Brother        prostate  . Cancer Maternal Grandmother    unknown  . Stroke Paternal Grandfather   . Colon cancer Neg Hx    History  Sexual Activity  . Sexual activity: Yes    Outpatient Encounter Prescriptions as of 10/15/2017  Medication Sig  . atorvastatin (LIPITOR) 40 MG tablet TAKE ONE (1) TABLET EACH DAY  . co-enzyme Q-10 50 MG capsule Take 50 mg by mouth daily.  Marland Kitchen ibuprofen (ADVIL,MOTRIN) 200 MG tablet Take 200 mg by mouth as needed for moderate pain.   Marland Kitchen lisinopril (PRINIVIL,ZESTRIL) 20 MG tablet TAKE ONE (1) TABLET EACH DAY  . loratadine (CLARITIN) 10 MG tablet Take 10 mg by mouth daily as needed for allergies.  . Nutritional Supplements (GRAPESEED EXTRACT PO) Take 2 capsules by mouth daily.   . ranitidine (ZANTAC) 75 MG tablet Take 75 mg by mouth daily as needed for heartburn.  . sodium chloride (OCEAN) 0.65 % SOLN nasal spray Place 1 spray into both nostrils as needed for congestion.  . [DISCONTINUED] MAGNESIUM PO Take 1 tablet by mouth daily.  . [DISCONTINUED] Melatonin 10 MG TABS Take 10 mg by mouth at bedtime as needed (sleep).  . [DISCONTINUED] Multiple Vitamin (MULTIVITAMIN WITH MINERALS)  TABS tablet Take 1 tablet by mouth daily.  . [DISCONTINUED] sulfamethoxazole-trimethoprim (BACTRIM DS,SEPTRA DS) 800-160 MG tablet Take 1 tablet by mouth 2 (two) times daily.   No facility-administered encounter medications on file as of 10/15/2017.     Activities of Daily Living In your present state of health, do you have any difficulty performing the following activities: 10/15/2017  Hearing? N  Vision? Y  Comment wears glasses regularly  Difficulty concentrating or making decisions? N  Walking or climbing stairs? N  Dressing or bathing? N  Doing errands, shopping? N  Some recent data might be hidden  wears glasses regularly.   Patient Care Team: Dettinger, Fransisca Kaufmann, MD as PCP - General (Family Medicine) Particia Nearing, [place] (Optometry) Rogene Houston, MD as Consulting Physician (Gastroenterology) Gari Crown, MD as  Referring Physician (Obstetrics and Gynecology)    Assessment:    Exercise Activities and Dietary recommendations    Goals    . Weight (lb) < 150 lb (68 kg)      Fall Risk Fall Risk  10/15/2017 08/29/2017 04/11/2017 08/26/2016 03/13/2016  Falls in the past year? No No No No No   Depression Screen PHQ 2/9 Scores 10/15/2017 08/29/2017 04/11/2017 08/26/2016  PHQ - 2 Score 0 0 0 0     Cognitive Function MMSE - Mini Mental State Exam 10/15/2017 03/13/2016  Orientation to time 5 5  Orientation to Place 5 5  Registration 3 3  Attention/ Calculation 5 5  Recall 3 3  Language- name 2 objects 2 2  Language- repeat 1 1  Language- follow 3 step command 3 3  Language- read & follow direction 1 1  Write a sentence 1 1  Copy design 1 1  Total score 30 30        Immunization History  Administered Date(s) Administered  . Influenza Whole 10/25/2012  . Influenza, High Dose Seasonal PF 10/06/2016  . Influenza,inj,Quad PF,6+ Mos 11/28/2013, 10/27/2014, 10/19/2015  . Pneumococcal Conjugate-13 07/14/2014, 04/11/2017  . Pneumococcal Polysaccharide-23 07/29/2015  . Td 01/27/2008   Screening Tests Health Maintenance  Topic Date Due  . TETANUS/TDAP  01/26/2018  . DEXA SCAN  05/28/2018  . MAMMOGRAM  05/19/2019  . COLONOSCOPY  03/22/2027  . INFLUENZA VACCINE  Completed  . Hepatitis C Screening  Completed  . PNA vac Low Risk Adult  Completed      Plan:    She will Bring in a copy of your advanced directives She will Work on you Goals that we set today over the next year She will Keep follow up with Dr Building control surveyor and other specialist We will recommend you get an EKG and a Chest Xray at your next office visit.  I have personally reviewed and noted the following in the patient's chart:   . Medical and social history . Use of alcohol, tobacco or illicit drugs  . Current medications and supplements . Functional ability and status . Nutritional status . Physical activity . Advanced  directives . List of other physicians . Hospitalizations, surgeries, and ER visits in previous 12 months . Vitals . Screenings to include cognitive, depression, and falls . Referrals and appointments  In addition, I have reviewed and discussed with patient certain preventive protocols, quality metrics, and best practice recommendations. A written personalized care plan for preventive services as well as general preventive health recommendations were provided to patient.     Gustave Lindeman, Cameron Proud, LPN  [id number]  I have reviewed and agree with the above AWV documentation.  I have reviewed and agree with the above AWV documentation.   Caryl Pina, MD [facility] 10/15/2017, 4:10 PM

## 2017-10-15 NOTE — Patient Instructions (Addendum)
  Teresa Patel , Thank you for taking time to come for your Medicare Wellness Visit. I appreciate your ongoing commitment to your health goals. Please review the following plan we discussed and let me know if I can assist you in the future.   These are the goals we discussed: Goals    . Weight (lb) < 150 lb (68 kg)       This is a list of the screening recommended for you and due dates:  Health Maintenance  Topic Date Due  . Tetanus Vaccine  01/26/2018  . DEXA scan (bone density measurement)  05/28/2018  . Mammogram  05/19/2019  . Colon Cancer Screening  03/22/2027  . Flu Shot  Completed  .  Hepatitis C: One time screening is recommended by Center for Disease Control  (CDC) for  adults born from [birth year] through 1967.   Completed  . Pneumonia vaccines  Completed    Bring in a copy of your advanced directives Work on you Goals that we set today over the next year Keep follow up with Dr Building control surveyor and other specialist We will recommend you get an EKG and a Chest Xray at your next office visit.

## 2017-12-21 ENCOUNTER — Encounter: Payer: Self-pay | Admitting: *Deleted

## 2018-04-12 ENCOUNTER — Ambulatory Visit: Payer: Medicare Other | Admitting: Family Medicine

## 2018-04-16 ENCOUNTER — Ambulatory Visit (INDEPENDENT_AMBULATORY_CARE_PROVIDER_SITE_OTHER): Payer: PPO | Admitting: Family Medicine

## 2018-04-16 ENCOUNTER — Encounter: Payer: Self-pay | Admitting: Family Medicine

## 2018-04-16 VITALS — BP 127/82 | HR 67 | Temp 98.3°F | Ht 65.5 in | Wt 165.0 lb

## 2018-04-16 DIAGNOSIS — K219 Gastro-esophageal reflux disease without esophagitis: Secondary | ICD-10-CM

## 2018-04-16 DIAGNOSIS — I10 Essential (primary) hypertension: Secondary | ICD-10-CM | POA: Diagnosis not present

## 2018-04-16 DIAGNOSIS — E782 Mixed hyperlipidemia: Secondary | ICD-10-CM

## 2018-04-16 DIAGNOSIS — Z9189 Other specified personal risk factors, not elsewhere classified: Secondary | ICD-10-CM | POA: Diagnosis not present

## 2018-04-16 NOTE — Progress Notes (Signed)
BP 127/82   Pulse 67   Temp 98.3 F (36.8 C) (Oral)   Ht 5' 5.5" (1.664 m)   Wt 165 lb (74.8 kg)   BMI 27.04 kg/m    Subjective:    Patient ID: Teresa Patel, female    DOB: May 27, 1951, 67 y.o.   MRN: [id number]  HPI: Teresa Patel is a 67 y.o. female presenting on 04/16/2018 for Hypertension and Hyperlipidemia  HPI Hypertension Patient is currently on lisinopril, and their blood pressure today is 127/82. Patient denies any lightheadedness or dizziness. Patient denies headaches, blurred vision, chest pains, shortness of breath, or weakness. Denies any side effects from medication and is content with current medication.   Hyperlipidemia Patient is coming in for recheck of his hyperlipidemia. The patient is currently taking Lipitor. They deny any issues with myalgias or history of liver damage from it. They deny any focal numbness or weakness or chest pain.  11.7% ASCVD risk  GERD Patient is currently on Zantac.  She denies any major symptoms or abdominal pain or belching or burping. She denies any blood in her stool or lightheadedness or dizziness.   Relevant past medical, surgical, family and social history reviewed and updated as indicated. Interim medical history since our last visit reviewed. Allergies and medications reviewed and updated.  Review of Systems  Constitutional: Negative for chills and fever.  Eyes: Negative for visual disturbance.  Respiratory: Negative for chest tightness and shortness of breath.   Cardiovascular: Negative for chest pain and leg swelling.  Gastrointestinal: Negative for abdominal pain.  Musculoskeletal: Negative for back pain and gait problem.  Skin: Negative for rash.  Neurological: Negative for dizziness, weakness, light-headedness, numbness and headaches.  Psychiatric/Behavioral: Negative for agitation and behavioral problems.  All other systems reviewed and are negative.   Per HPI unless specifically indicated above   Allergies as  of 04/16/2018   No Known Allergies     Medication List        Accurate as of 04/16/18  9:31 AM. Always use your most recent med list.          atorvastatin 40 MG tablet Commonly known as:  LIPITOR TAKE ONE (1) TABLET EACH DAY   co-enzyme Q-10 50 MG capsule Take 50 mg by mouth daily.   GRAPESEED EXTRACT PO Take 2 capsules by mouth daily.   ibuprofen 200 MG tablet Commonly known as:  ADVIL,MOTRIN Take 200 mg by mouth as needed for moderate pain.   lisinopril 20 MG tablet Commonly known as:  PRINIVIL,ZESTRIL TAKE ONE (1) TABLET EACH DAY   loratadine 10 MG tablet Commonly known as:  CLARITIN Take 10 mg by mouth daily as needed for allergies.   ranitidine 75 MG tablet Commonly known as:  ZANTAC Take 75 mg by mouth daily as needed for heartburn.   sodium chloride 0.65 % Soln nasal spray Commonly known as:  OCEAN Place 1 spray into both nostrils as needed for congestion.          Objective:    BP 127/82   Pulse 67   Temp 98.3 F (36.8 C) (Oral)   Ht 5' 5.5" (1.664 m)   Wt 165 lb (74.8 kg)   BMI 27.04 kg/m   Wt Readings from Last 3 Encounters:  04/16/18 165 lb (74.8 kg)  10/15/17 165 lb (74.8 kg)  08/29/17 165 lb 3.2 oz (74.9 kg)    Physical Exam  Constitutional: She is oriented to person, place, and time. She appears well-developed and  well-nourished. No distress.  Eyes: Conjunctivae are normal.  Cardiovascular: Normal rate, regular rhythm, normal heart sounds and intact distal pulses.  No murmur heard. Pulmonary/Chest: Effort normal and breath sounds normal. No respiratory distress. She has no wheezes.  Musculoskeletal: Normal range of motion. She exhibits no edema or tenderness.  Neurological: She is alert and oriented to person, place, and time. Coordination normal.  Skin: Skin is warm and dry. No rash noted. She is not diaphoretic.  Psychiatric: She has a normal mood and affect. Her behavior is normal.  Nursing note and vitals reviewed.         Assessment & Plan:   Problem List Items Addressed This Visit      Cardiovascular and Mediastinum   Hypertension - Primary   Relevant Orders   Exercise Tolerance Test   CMP14+EGFR (Completed)   CBC with Differential/Platelet (Completed)   Lipid panel (Completed)     Digestive   GERD (gastroesophageal reflux disease)   Relevant Orders   CBC with Differential/Platelet (Completed)     Other   Hyperlipidemia   Relevant Orders   Exercise Tolerance Test   CMP14+EGFR (Completed)   CBC with Differential/Platelet (Completed)   Lipid panel (Completed)    Other Visit Diagnoses    [place] cardiac risk 10-20% in next 10 years       Relevant Orders   Exercise Tolerance Test      The 10-year ASCVD risk score Mikey Bussing DC Jr., et al., 2013) is: 11.7%   Values used to calculate the score:     Age: 61 years     Sex: Female     Is Non-Hispanic African American: No     Diabetic: No     Tobacco smoker: No     Systolic Blood Pressure: 166 mmHg     Is BP treated: Yes     HDL Cholesterol: 46 mg/dL     Total Cholesterol: 203 mg/dL   Discussed ASCVD risk with patient and watching for warning signs of underlying cardiac disease, also discussed possibly doing an exercise tolerance test for her in the future.  We will run labs today. Continue Lipitor and lisinopril and Zantac Follow up plan: Return in about 1 year (around 04/17/2019), or if symptoms worsen or fail to improve, for Hypertension and cholesterol recheck.  Counseling provided for all of the vaccine components Orders Placed This Encounter  Procedures  . CMP14+EGFR  . CBC with Differential/Platelet  . Lipid panel  . Exercise Tolerance Test    Caryl Pina, MD [facility] 04/16/2018, 9:31 AM

## 2018-04-17 DIAGNOSIS — Z1272 Encounter for screening for malignant neoplasm of vagina: Secondary | ICD-10-CM | POA: Diagnosis not present

## 2018-04-17 DIAGNOSIS — Z01419 Encounter for gynecological examination (general) (routine) without abnormal findings: Secondary | ICD-10-CM | POA: Diagnosis not present

## 2018-04-17 LAB — CMP14+EGFR
ALBUMIN: 4.2 g/dL (ref 3.6–4.8)
ALK PHOS: 78 IU/L (ref 39–117)
ALT: 19 IU/L (ref 0–32)
AST: 20 IU/L (ref 0–40)
Albumin/Globulin Ratio: 1.4 (ref 1.2–2.2)
BUN / CREAT RATIO: 18 (ref 12–28)
BUN: 13 mg/dL (ref 8–27)
Bilirubin Total: 0.4 mg/dL (ref 0.0–1.2)
CALCIUM: 10.1 mg/dL (ref 8.7–10.3)
CO2: 24 mmol/L (ref 20–29)
CREATININE: 0.74 mg/dL (ref 0.57–1.00)
Chloride: 106 mmol/L (ref 96–106)
GFR calc Af Amer: 96 mL/min/{1.73_m2} (ref >59)
GFR, EST NON AFRICAN AMERICAN: 83 mL/min/{1.73_m2} (ref >59)
GLUCOSE: 102 mg/dL — AB (ref 65–99)
Globulin, Total: 3.1 g/dL (ref 1.5–4.5)
Potassium: 4.8 mmol/L (ref 3.5–5.2)
Sodium: 142 mmol/L (ref 134–144)
Total Protein: 7.3 g/dL (ref 6.0–8.5)

## 2018-04-17 LAB — LIPID PANEL
CHOL/HDL RATIO: 3.8 ratio (ref 0.0–4.4)
Cholesterol, Total: 175 mg/dL (ref 100–199)
HDL: 46 mg/dL (ref >39)
LDL CALC: 96 mg/dL (ref 0–99)
TRIGLYCERIDES: 163 mg/dL — AB (ref 0–149)
VLDL CHOLESTEROL CAL: 33 mg/dL (ref 5–40)

## 2018-04-17 LAB — CBC WITH DIFFERENTIAL/PLATELET
BASOS: 1 %
Basophils Absolute: 0 10*3/uL (ref 0.0–0.2)
EOS (ABSOLUTE): 0.1 10*3/uL (ref 0.0–0.4)
EOS: 1 %
HEMATOCRIT: 41.5 % (ref 34.0–46.6)
HEMOGLOBIN: 13.7 g/dL (ref 11.1–15.9)
Immature Grans (Abs): 0 10*3/uL (ref 0.0–0.1)
Immature Granulocytes: 0 %
Lymphocytes Absolute: 2.3 10*3/uL (ref 0.7–3.1)
Lymphs: 40 %
MCH: 29 pg (ref 26.6–33.0)
MCHC: 33 g/dL (ref 31.5–35.7)
MCV: 88 fL (ref 79–97)
MONOCYTES: 12 %
Monocytes Absolute: 0.7 10*3/uL (ref 0.1–0.9)
Neutrophils Absolute: 2.7 10*3/uL (ref 1.4–7.0)
Neutrophils: 46 %
Platelets: 316 10*3/uL (ref 150–379)
RBC: 4.73 x10E6/uL (ref 3.77–5.28)
RDW: 13.4 % (ref 12.3–15.4)
WBC: 5.7 10*3/uL (ref 3.4–10.8)

## 2018-04-19 LAB — SPECIMEN STATUS REPORT

## 2018-04-19 LAB — HGB A1C W/O EAG: HEMOGLOBIN A1C: 5.7 % — AB (ref 4.8–5.6)

## 2018-04-22 ENCOUNTER — Telehealth: Payer: Self-pay | Admitting: *Deleted

## 2018-04-22 NOTE — Telephone Encounter (Signed)
LMTCB to schedule treadmill.

## 2018-04-22 NOTE — Telephone Encounter (Signed)
Appt scheduled for June 6th @ 8:30am.

## 2018-05-15 ENCOUNTER — Other Ambulatory Visit: Payer: Self-pay | Admitting: Family Medicine

## 2018-05-23 ENCOUNTER — Encounter: Payer: Self-pay | Admitting: Family Medicine

## 2018-05-23 ENCOUNTER — Ambulatory Visit (INDEPENDENT_AMBULATORY_CARE_PROVIDER_SITE_OTHER): Payer: PPO

## 2018-05-23 DIAGNOSIS — I10 Essential (primary) hypertension: Secondary | ICD-10-CM

## 2018-05-23 DIAGNOSIS — Z9189 Other specified personal risk factors, not elsewhere classified: Secondary | ICD-10-CM | POA: Diagnosis not present

## 2018-05-23 DIAGNOSIS — E782 Mixed hyperlipidemia: Secondary | ICD-10-CM

## 2018-05-29 LAB — EXERCISE TOLERANCE TEST
CHL CUP MPHR: 151 {beats}/min
CHL CUP RESTING HR STRESS: 85 {beats}/min
CSEPPHR: 136 {beats}/min
Estimated workload: 5.7 METS
Exercise duration (min): 4 min
Exercise duration (sec): 22 s
Percent HR: 90 %
RPE: 4

## 2018-07-29 ENCOUNTER — Telehealth: Payer: Self-pay | Admitting: Family Medicine

## 2018-07-29 MED ORDER — LISINOPRIL 20 MG PO TABS: 20.0000 mg | ORAL_TABLET | Freq: Every day | ORAL | 3 refills | Status: DC

## 2018-07-29 MED ORDER — ATORVASTATIN CALCIUM 40 MG PO TABS: 40.0000 mg | ORAL_TABLET | Freq: Every day | ORAL | 3 refills | Status: DC

## 2018-07-29 NOTE — Telephone Encounter (Signed)
rx sent

## 2018-08-06 DIAGNOSIS — Z1231 Encounter for screening mammogram for malignant neoplasm of breast: Secondary | ICD-10-CM | POA: Diagnosis not present

## 2018-10-21 ENCOUNTER — Encounter: Payer: Self-pay | Admitting: *Deleted

## 2018-10-21 ENCOUNTER — Ambulatory Visit (INDEPENDENT_AMBULATORY_CARE_PROVIDER_SITE_OTHER): Payer: PPO | Admitting: *Deleted

## 2018-10-21 VITALS — BP 121/73 | HR 76 | Ht 65.5 in | Wt 163.0 lb

## 2018-10-21 DIAGNOSIS — Z0000 Encounter for general adult medical examination without abnormal findings: Secondary | ICD-10-CM | POA: Diagnosis not present

## 2018-10-21 DIAGNOSIS — Z780 Asymptomatic menopausal state: Secondary | ICD-10-CM

## 2018-10-21 DIAGNOSIS — Z23 Encounter for immunization: Secondary | ICD-10-CM

## 2018-10-21 NOTE — Patient Instructions (Signed)
  Ms. Cassaro , Thank you for taking time to come for your Medicare Wellness Visit. I appreciate your ongoing commitment to your health goals. Please review the following plan we discussed and let me know if I can assist you in the future.   These are the goals we discussed: Goals    . DIET - INCREASE WATER INTAKE    . Exercise 150 min/wk Moderate Activity    . Weight (lb) < 150 lb (68 kg)       This is a list of the screening recommended for you and due dates:  Health Maintenance  Topic Date Due  . Tetanus Vaccine  01/26/2018  . DEXA scan (bone density measurement)  05/28/2018  . Flu Shot  07/18/2018  . Mammogram  05/19/2019  . Colon Cancer Screening  03/22/2027  .  Hepatitis C: One time screening is recommended by Center for Disease Control  (CDC) for  adults born from [birth year] through 1967.   Completed  . Pneumonia vaccines  Completed

## 2018-10-21 NOTE — Progress Notes (Signed)
Subjective:   Teresa Patel is a 67 y.o. female who presents for a subsequent Medicare Annual Wellness Visit.  Patient Care Team: Dettinger, Fransisca Kaufmann, MD as PCP - General (Family Medicine) Particia Nearing, [place] (Optometry) Rogene Houston, MD as Consulting Physician (Gastroenterology) Gari Crown, MD as Referring Physician (Obstetrics and Gynecology)  Hospitalizations, surgeries, and ER visits in previous 12 months No hospitalizations, ER visits, or surgeries this past year.   Review of Systems    Patient reports that her overall health is unchanged compared to last year.  All other systems negative     Current Medications (verified) Outpatient Encounter Medications as of 10/21/2018  Medication Sig  . atorvastatin (LIPITOR) 40 MG tablet Take 1 tablet (40 mg total) by mouth daily at 6 PM. (Patient taking differently: Take 40 mg by mouth daily at 6 PM. Takes 1 tablet every other day per patient)  . co-enzyme Q-10 50 MG capsule Take 50 mg by mouth daily.  Marland Kitchen ibuprofen (ADVIL,MOTRIN) 200 MG tablet Take 200 mg by mouth as needed for moderate pain.   Marland Kitchen lisinopril (PRINIVIL,ZESTRIL) 20 MG tablet Take 1 tablet (20 mg total) by mouth daily. (Patient taking differently: Take 20 mg by mouth daily. 1/2 pill daily per patient)  . loratadine (CLARITIN) 10 MG tablet Take 10 mg by mouth daily as needed for allergies.  . Nutritional Supplements (GRAPESEED EXTRACT PO) Take 2 capsules by mouth daily.   . ranitidine (ZANTAC) 75 MG tablet Take 75 mg by mouth daily as needed for heartburn.  . sodium chloride (OCEAN) 0.65 % SOLN nasal spray Place 1 spray into both nostrils as needed for congestion.   No facility-administered encounter medications on file as of 10/21/2018.     Allergies (verified) Patient has no known allergies.   History: Past Medical History:  Diagnosis Date  . Anxiety   . GERD (gastroesophageal reflux disease)   . Hyperlipidemia   . Hypertension   . Shingles    Past Surgical  History:  Procedure Laterality Date  . ABDOMINAL HYSTERECTOMY    . carpal tunnel Bilateral   . COLONOSCOPY N/A 03/21/2017   Procedure: COLONOSCOPY;  Surgeon: Rogene Houston, MD;  [facility];  Service: Endoscopy;  Laterality: N/A;  1030  . POLYPECTOMY  03/21/2017   Procedure: POLYPECTOMY;  Surgeon: Rogene Houston, MD;  [facility];  Service: Endoscopy;;  colon   Family History  Problem Relation Age of Onset  . Heart disease Mother   . Heart disease Father   . Cancer Father        prostate  . Cancer Brother        pancreatic  . Heart disease Brother        heart attack  . Heart disease Brother   . Diabetes Brother   . Kidney disease Brother   . Cancer Brother        prostate  . Melanoma Daughter 44  . Cancer Maternal Grandmother        unknown  . Stroke Paternal Grandfather   . Colon cancer Neg Hx    Social History   Socioeconomic History  . Marital status: Married    Spouse name: Not on file  . Number of children: Not on file  . Years of education: Not on file  . Highest education level: Not on file  Occupational History  . Not on file  Social Needs  . Financial resource strain: Not on file  . Food  insecurity:    Worry: Not on file    Inability: Not on file  . Transportation needs:    Medical: Not on file    Non-medical: Not on file  Tobacco Use  . Smoking status: Passive Smoke Exposure - Never Smoker  . Smokeless tobacco: Never Used  Substance and Sexual Activity  . Alcohol use: No  . Drug use: No  . Sexual activity: Yes  Lifestyle  . Physical activity:    Days per week: Not on file    Minutes per session: Not on file  . Stress: Not on file  Relationships  . Social connections:    Talks on phone: Not on file    Gets together: Not on file    Attends religious service: Not on file    Active member of club or organization: Not on file    Attends meetings of clubs or organizations: Not on file    Relationship status: Not on file    Other Topics Concern  . Not on file  Social History Narrative  . Not on file     Clinical Intake:              How often do you need to have someone help you when you read instructions, pamphlets, or other written materials from your doctor or pharmacy?: 1 - Never What is the last grade level you completed in school?: high school         Activities of Daily Living No flowsheet data found.   Exercise     Depression Screen PHQ 2/9 Scores 10/21/2018 10/15/2017 08/29/2017 04/11/2017 08/26/2016 08/22/2016 03/13/2016  PHQ - 2 Score 0 0 0 0 0 0 0     Fall Risk Fall Risk  10/21/2018 10/15/2017 08/29/2017 04/11/2017 08/26/2016  Falls in the past year? 0 No No No No  Number falls in past yr: 0 - - - -     Objective:    Today's Vitals   10/21/18 0842  BP: 121/73  Pulse: 76   There is no height or weight on file to calculate BMI.  Advanced Directives 10/15/2017 03/21/2017 03/13/2016  Does Patient Have a Medical Advance Directive? Yes Yes Yes  Type of Paramedic of [place];Living will [place];Living will [place];Living will  Does patient want to make changes to medical advance directive? No - Patient declined - No - Patient declined  Copy of [place] in Chart? No - copy requested No - copy requested No - copy requested    Hearing/Vision  No hearing or vision deficits noted during visit.  Cognitive Function: MMSE - Mini Mental State Exam 10/15/2017 03/13/2016  Orientation to time 5 5  Orientation to Place 5 5  Registration 3 3  Attention/ Calculation 5 5  Recall 3 3  Language- name 2 objects 2 2  Language- repeat 1 1  Language- follow 3 step command 3 3  Language- read & follow direction 1 1  Write a sentence 1 1  Copy design 1 1  Total score 30 30     6CIT Screen 10/21/2018  What Year? 0 points  What month? 0 points  What time? 0 points  Count back from 20 0 points  Months in  reverse 0 points  Repeat phrase 0 points  Total Score 0   Normal Cognitive Function Screening: Yes    Immunizations and Health Maintenance Immunization History  Administered Date(s) Administered  . Influenza Whole  10/25/2012  . Influenza, High Dose Seasonal PF 10/06/2016, 10/15/2017, 10/21/2018  . Influenza,inj,Quad PF,6+ Mos 11/28/2013, 10/27/2014, 10/19/2015  . Pneumococcal Conjugate-13 07/14/2014, 04/11/2017  . Pneumococcal Polysaccharide-23 07/29/2015  . Td 01/27/2008   Health Maintenance Due  Topic Date Due  . TETANUS/TDAP  01/26/2018  . DEXA SCAN  05/28/2018   Health Maintenance  Topic Date Due  . TETANUS/TDAP  01/26/2018  . DEXA SCAN  05/28/2018  . MAMMOGRAM  05/19/2019  . COLONOSCOPY  03/22/2027  . INFLUENZA VACCINE  Completed  . Hepatitis C Screening  Completed  . PNA vac Low Risk Adult  Completed        Assessment:   This is a routine wellness examination for Teresa Patel.    Plan:    Goals    . DIET - INCREASE WATER INTAKE    . Exercise 150 min/wk Moderate Activity    . Weight (lb) < 150 lb (68 kg)        Health Maintenance Recommendations: Td vaccine  Dexa scan Flu vaccine  Additional Screening Recommendations: Lung: Low Dose CT Chest recommended if Age 22-80 years, 30 pack-year currently smoking OR have quit w/in 15years. Patient does not qualify. Hepatitis C Screening recommended: no  Today's Orders Orders Placed This Encounter  Procedures  . DG [facility] DEXA    Order Specific Question:   Reason for Exam (SYMPTOM  OR DIAGNOSIS REQUIRED)    Answer:   post menopausal  . Flu vaccine HIGH DOSE PF    Keep f/u with Dettinger, Fransisca Kaufmann, MD and any other specialty appointments you may have Continue current medications Move carefully to avoid falls. Use assistive devices like a cane or walker if needed. Aim for at least 150 minutes of moderate activity a week. This can be done with chair exercises if necessary. Read or work on puzzles daily Stay  connected with friends and family  I have personally reviewed and noted the following in the patient's chart:   . Medical and social history . Use of alcohol, tobacco or illicit drugs  . Current medications and supplements . Functional ability and status . Nutritional status . Physical activity . Advanced directives . List of other physicians . Hospitalizations, surgeries, and ER visits in previous 12 months . Vitals . Screenings to include cognitive, depression, and falls . Referrals and appointments  In addition, I have reviewed and discussed with patient certain preventive protocols, quality metrics, and best practice recommendations. A written personalized care plan for preventive services as well as general preventive health recommendations were provided to patient.     Chong Sicilian, RN   10/21/2018

## 2018-10-25 ENCOUNTER — Encounter: Payer: Self-pay | Admitting: *Deleted

## 2018-10-28 ENCOUNTER — Other Ambulatory Visit: Payer: PPO

## 2018-10-31 NOTE — Progress Notes (Signed)
BP 121/73 (BP Location: Right Arm, Patient Position: Sitting, Cuff Size: Normal)   Pulse 76   Ht 5' 5.5" (1.664 m)   Wt 163 lb (73.9 kg)   BMI 26.71 kg/m

## 2018-11-04 ENCOUNTER — Ambulatory Visit (INDEPENDENT_AMBULATORY_CARE_PROVIDER_SITE_OTHER): Payer: PPO

## 2018-11-04 DIAGNOSIS — Z780 Asymptomatic menopausal state: Secondary | ICD-10-CM | POA: Diagnosis not present

## 2018-11-04 DIAGNOSIS — M85852 Other specified disorders of bone density and structure, left thigh: Secondary | ICD-10-CM | POA: Diagnosis not present

## 2018-12-02 ENCOUNTER — Telehealth: Payer: Self-pay | Admitting: Family Medicine

## 2018-12-02 DIAGNOSIS — F458 Other somatoform disorders: Secondary | ICD-10-CM

## 2018-12-02 NOTE — Telephone Encounter (Signed)
Just do a referral to a sleep center and have them see her.

## 2018-12-02 NOTE — Telephone Encounter (Signed)
Referral placed patient aware  

## 2018-12-02 NOTE — Telephone Encounter (Signed)
Pt states she has been to the dentist and wants to do a mouth guard but would like her to have sleep study first. Does she ntbs or can we just do referral?

## 2019-01-23 ENCOUNTER — Institutional Professional Consult (permissible substitution): Payer: PPO | Admitting: Neurology

## 2019-02-11 ENCOUNTER — Ambulatory Visit: Payer: PPO | Admitting: Neurology

## 2019-02-11 ENCOUNTER — Telehealth: Payer: Self-pay | Admitting: Neurology

## 2019-02-11 ENCOUNTER — Encounter: Payer: Self-pay | Admitting: Neurology

## 2019-02-11 VITALS — BP 138/89 | HR 77 | Ht 65.0 in | Wt 169.0 lb

## 2019-02-11 DIAGNOSIS — I10 Essential (primary) hypertension: Secondary | ICD-10-CM | POA: Diagnosis not present

## 2019-02-11 DIAGNOSIS — K219 Gastro-esophageal reflux disease without esophagitis: Secondary | ICD-10-CM | POA: Diagnosis not present

## 2019-02-11 DIAGNOSIS — F458 Other somatoform disorders: Secondary | ICD-10-CM

## 2019-02-11 DIAGNOSIS — R0683 Snoring: Secondary | ICD-10-CM

## 2019-02-11 DIAGNOSIS — E782 Mixed hyperlipidemia: Secondary | ICD-10-CM | POA: Diagnosis not present

## 2019-02-11 NOTE — Telephone Encounter (Signed)
Called the patient's dentist Dr Andree Elk, DDS in [place] to get their fax number so that we can have the capability to fax office notes/sleep studies to him for continuity of care. This was requested by the patient and a medical release was filled out for the patient. I advised the dentist to call back and leave the fax number to their office.

## 2019-02-11 NOTE — Telephone Encounter (Signed)
You called Dr Andree Elk office and needed fax number Lawerance Bach called back and here is fax number [phone]

## 2019-02-11 NOTE — Telephone Encounter (Signed)
I have added that fax number on the medical records release form and once patient completes study we can forward all notes and study at that time.

## 2019-02-11 NOTE — Progress Notes (Signed)
SLEEP MEDICINE CLINIC   Provider:  Larey Seat, MD   Primary Care Physician:  Dettinger, Fransisca Kaufmann, MD   Referring Provider: Dettinger, Fransisca Kaufmann, MD - Dentist Richard Miu, DDS in [place], requested sleep study to treat bruxism, TMJ, question of apnea being also present.     Chief Complaint  Patient presents with  . New Patient (Initial Visit)    pt with husband, rm 53. pt states that she clinches her jaw in sleep and her dentist wanted to make a mouthguard to help with that but first wanted her to have a sleep study completed because the patient states that snores in sleep. never had a sleep study before.    HPI:  Teresa Patel is a 68 y.o. female patient , seen here as in a referral from Dr. Warrick Parisian via dentist to evaluate if snoring and bruxism are related to OSA or not.  Chief complaint according to patient :" My dentist wants to make the right appliance for me".   The patient is post menopausal, has well controlled HTN and hyperlipidemia and was recently seen for a wellness check at PCP office. She has no rhinitis, no headaches, and breathes through the nose easily. She has retrognathia.  She rarely has diaphoresis these days, no palpitations.   Sleep habits are as follows: The patient endorsed at dinnertime before 6 PM usually, denies without any alcohol.  Usually the bedtime for the couple is between 10 and 11 PM, the bedroom is usually cool quiet and dark.  Mrs. Morozov prefers to sleep on her side, she has a raised head of bed and one pillow for support. Stable.  Most times there is no trouble to initiate sleep, she wakes up between 3-4 AM, and gets back to sleep. Sometimes she has a bathroom break. Her husband , who is now retired, used to get up at 4 AM for work. She sleeps for another 3 hours usually rising at 7 AM, spontaneously without alarm being set. She would like a short nap after lunch for between 2 and 3 PM but this is a time when the grandchildren need to be  picked up from school.  On weekends she may be able to take a nap.  A nap will only last 10 to 15 minutes, and she wakes refreshed from this power nap.   Sleep medical history:  and family sleep history:    Social history:   Review of Systems: Out of a complete 14 system review, the patient complains of only the following symptoms, and all other reviewed systems are negative. Snoring. TMJ.   Epworth Sleepiness score : 11/ 24   , Fatigue severity score: 16/ 63   , depression score: 0 /15    Social History   Socioeconomic History  . Marital status: Married    Spouse name: Not on file  . Number of children: Not on file  . Years of education: Not on file  . Highest education level: Not on file  Occupational History  . Not on file  Social Needs  . Financial resource strain: Not on file  . Food insecurity:    Worry: Not on file    Inability: Not on file  . Transportation needs:    Medical: Not on file    Non-medical: Not on file  Tobacco Use  . Smoking status: Passive Smoke Exposure - Never Smoker  . Smokeless tobacco: Never Used  Substance and Sexual Activity  . Alcohol use: No  .  Drug use: No  . Sexual activity: Yes  Lifestyle  . Physical activity:    Days per week: Not on file    Minutes per session: Not on file  . Stress: Not on file  Relationships  . Social connections:    Talks on phone: Not on file    Gets together: Not on file    Attends religious service: Not on file    Active member of club or organization: Not on file    Attends meetings of clubs or organizations: Not on file    Relationship status: Not on file  . Intimate partner violence:    Fear of current or ex partner: Not on file    Emotionally abused: Not on file    Physically abused: Not on file    Forced sexual activity: Not on file  Other Topics Concern  . Not on file  Social History Narrative  . Not on file    Family History  Problem Relation Age of Onset  . Heart disease Mother   .  Heart disease Father   . Cancer Father        prostate  . Cancer Brother        pancreatic  . Heart disease Brother        heart attack  . Heart disease Brother   . Diabetes Brother   . Kidney disease Brother   . Cancer Brother        prostate  . Melanoma Daughter 57  . Cancer Maternal Grandmother        unknown  . Stroke Paternal Grandfather   . Colon cancer Neg Hx     Past Medical History:  Diagnosis Date  . Anxiety   . GERD (gastroesophageal reflux disease)   . Hyperlipidemia   . Hypertension   . Shingles     Past Surgical History:  Procedure Laterality Date  . ABDOMINAL HYSTERECTOMY    . carpal tunnel Bilateral   . COLONOSCOPY N/A 03/21/2017   Procedure: COLONOSCOPY;  Surgeon: Rogene Houston, MD;  [facility]: [facility];  Service: Endoscopy;  Laterality: N/A;  1030  . POLYPECTOMY  03/21/2017   Procedure: POLYPECTOMY;  Surgeon: Rogene Houston, MD;  Location: [facility];  Service: Endoscopy;;  colon    Current Outpatient Medications  Medication Sig Dispense Refill  . atorvastatin (LIPITOR) 40 MG tablet Take 1 tablet (40 mg total) by mouth daily at 6 PM. (Patient taking differently: Take 40 mg by mouth every other day. Takes 1 tablet every other day per patient) 90 tablet 3  . co-enzyme Q-10 50 MG capsule Take 50 mg by mouth daily.    Marland Kitchen ibuprofen (ADVIL,MOTRIN) 200 MG tablet Take 200 mg by mouth as needed for moderate pain.     Marland Kitchen lisinopril (PRINIVIL,ZESTRIL) 20 MG tablet Take 1 tablet (20 mg total) by mouth daily. (Patient taking differently: Take 10 mg by mouth daily. 1/2 pill daily per patient) 90 tablet 3  . loratadine (CLARITIN) 10 MG tablet Take 10 mg by mouth daily as needed for allergies.    . Nutritional Supplements (GRAPESEED EXTRACT PO) Take 2 capsules by mouth daily.     . ranitidine (ZANTAC) 75 MG tablet Take 75 mg by mouth daily as needed for heartburn.    . sodium chloride (OCEAN) 0.65 % SOLN nasal spray Place 1 spray into both nostrils as needed for  congestion.     No current facility-administered medications for this visit.  Allergies as of 02/11/2019  . (No Known Allergies)    Vitals: BP 138/89   Pulse 77   Ht 5\' 5"  (1.651 m)   Wt 169 lb (76.7 kg)   BMI 28.12 kg/m  Last Weight:  Wt Readings from Last 1 Encounters:  02/11/19 169 lb (76.7 kg)   TMH:DQQI mass index is 28.12 kg/m.     Last Height:   Ht Readings from Last 1 Encounters:  02/11/19 5\' 5"  (1.651 m)    Physical exam:  General: The patient is awake, alert and appears not in acute distress. The patient is well groomed. Head: Normocephalic, atraumatic. Neck is supple. Mallampati 3,  neck circumference:14" . Nasal airflow patent  TMJ click is  evident . Retrognathia is seen.  Cardiovascular:  Regular rate and rhythm, without  murmurs or carotid bruit, and without distended neck veins. Respiratory: Lungs are clear to auscultation. Skin:  Without evidence of edema, or rash Trunk: BMI is 28.12. The patient's posture is erect.   Neurologic exam : The patient is awake and alert, oriented to place and time.  MMSE: MMSE - Mini Mental State Exam 10/15/2017 03/13/2016  Orientation to time 5 5  Orientation to Place 5 5  Registration 3 3  Attention/ Calculation 5 5  Recall 3 3  Language- name 2 objects 2 2  Language- repeat 1 1  Language- follow 3 step command 3 3  Language- read & follow direction 1 1  Write a sentence 1 1  Copy design 1 1  Total score 30 30    Attention span & concentration ability appears normal.  Speech is fluent, without dysarthria, dysphonia or aphasia.  Mood and affect are appropriate.  Cranial nerves: Pupils are equal and briskly reactive to light. Funduscopic exam deferred. Extraocular movements  in vertical and horizontal planes intact and without nystagmus. Visual fields by finger perimetry are intact. Hearing to finger rub intact. Facial sensation intact to fine touch. Facial motor strength is symmetric and tongue and uvula move  midline. Shoulder shrug was symmetrical.  Motor exam:  Normal tone, muscle bulk and symmetric strength in all extremities. Sensory:  Fine touch, pinprick and vibration were tested in all extremities. Proprioception tested in the upper extremities was normal. Coordination: Rapid alternating movements in the fingers/hands was normal. Finger-to-nose maneuver  normal without evidence of ataxia, dysmetria or tremor. Gait and station: Patient walks without assistive device. Strength within normal limits. Stance is stable and normal.  Deep tendon reflexes: in the upper and lower extremities are symmetric and intact.   Assessment:  After physical and neurologic examination, review of laboratory studies,  Personal review of imaging studies, reports of other /same  Imaging studies, results of polysomnography and / or neurophysiology testing and pre-existing records as far as provided in visit., my assessment is:   1) mild - moderate risk of OSA, retrognathia is the main anatomical reason. She has a small mouth. Snoring but not apnea was observed by her husband    The patient was advised of the nature of the diagnosed disorder , the treatment options and the  risks for general health and wellness arising from not treating the condition.   I spent more than 35 minutes of face to face time with the patient.  Greater than 50% of time was spent in counseling and coordination of care. We have discussed the diagnosis and differential and I answered the patient's questions.    Plan:  Treatment plan and additional workup :  Rule  out apnea - rule in apnea for dental device treatment if obstructive in origin.     Larey Seat, MD 2/76/1470, 9:29 AM  Certified in Neurology by ABPN Certified in [place] by [facility] [address] [place], [place] [address]

## 2019-02-17 ENCOUNTER — Ambulatory Visit (INDEPENDENT_AMBULATORY_CARE_PROVIDER_SITE_OTHER): Payer: PPO | Admitting: Neurology

## 2019-02-17 DIAGNOSIS — R0683 Snoring: Secondary | ICD-10-CM

## 2019-02-17 DIAGNOSIS — F458 Other somatoform disorders: Secondary | ICD-10-CM

## 2019-02-17 DIAGNOSIS — I10 Essential (primary) hypertension: Secondary | ICD-10-CM

## 2019-02-17 DIAGNOSIS — G4733 Obstructive sleep apnea (adult) (pediatric): Secondary | ICD-10-CM | POA: Diagnosis not present

## 2019-02-17 DIAGNOSIS — K219 Gastro-esophageal reflux disease without esophagitis: Secondary | ICD-10-CM

## 2019-02-17 DIAGNOSIS — E782 Mixed hyperlipidemia: Secondary | ICD-10-CM

## 2019-02-21 ENCOUNTER — Encounter: Payer: Self-pay | Admitting: Family Medicine

## 2019-02-21 ENCOUNTER — Ambulatory Visit (INDEPENDENT_AMBULATORY_CARE_PROVIDER_SITE_OTHER): Payer: PPO | Admitting: Family Medicine

## 2019-02-21 VITALS — BP 138/86 | HR 73 | Temp 97.8°F | Ht 65.0 in | Wt 167.0 lb

## 2019-02-21 DIAGNOSIS — B3731 Acute candidiasis of vulva and vagina: Secondary | ICD-10-CM

## 2019-02-21 DIAGNOSIS — B373 Candidiasis of vulva and vagina: Secondary | ICD-10-CM | POA: Diagnosis not present

## 2019-02-21 DIAGNOSIS — N3001 Acute cystitis with hematuria: Secondary | ICD-10-CM | POA: Diagnosis not present

## 2019-02-21 DIAGNOSIS — J029 Acute pharyngitis, unspecified: Secondary | ICD-10-CM

## 2019-02-21 DIAGNOSIS — R8290 Unspecified abnormal findings in urine: Secondary | ICD-10-CM | POA: Diagnosis not present

## 2019-02-21 LAB — MICROSCOPIC EXAMINATION

## 2019-02-21 LAB — URINALYSIS, COMPLETE
BILIRUBIN UA: NEGATIVE
Glucose, UA: NEGATIVE
Ketones, UA: NEGATIVE
Nitrite, UA: NEGATIVE
Protein, UA: NEGATIVE
Specific Gravity, UA: 1.015 (ref 1.005–1.030)
Urobilinogen, Ur: 0.2 mg/dL (ref 0.2–1.0)
pH, UA: 5.5 (ref 5.0–7.5)

## 2019-02-21 LAB — CULTURE, GROUP A STREP

## 2019-02-21 LAB — RAPID STREP SCREEN (MED CTR MEBANE ONLY): Strep Gp A Ag, IA W/Reflex: NEGATIVE

## 2019-02-21 MED ORDER — CEPHALEXIN 500 MG PO CAPS: 500.0000 mg | ORAL_CAPSULE | Freq: Two times a day (BID) | ORAL | 0 refills | Status: AC

## 2019-02-21 MED ORDER — FLUCONAZOLE 150 MG PO TABS: 150.0000 mg | ORAL_TABLET | Freq: Once | ORAL | 0 refills | Status: AC

## 2019-02-21 MED ORDER — MAGIC MOUTHWASH W/LIDOCAINE: ORAL | 0 refills | Status: DC

## 2019-02-21 NOTE — Patient Instructions (Signed)
Strep is negative.  You likely have a sore throat from a cold virus. I have given you a mouthwash to use for sore throat.  Ok to continue salt water gargles before use of the mouthwash.  It appears that you have a viral upper respiratory infection (cold).  Cold symptoms can last up to 2 weeks.  I recommend that you only use cold medications that are safe in high blood pressure like Coricidin (generic is fine).  Other cold medications can increase your blood pressure.    - Get plenty of rest and drink plenty of fluids. - Try to breathe moist air. Use a cold mist humidifier. - Consume warm fluids (soup or tea) to provide relief for a stuffy nose and to loosen phlegm. - For nasal stuffiness, try saline nasal spray or a Neti Pot.  Afrin nasal spray can also be used but this product should not be used longer than 3 days or it will cause rebound nasal stuffiness (worsening nasal congestion). - For sore throat pain relief: use chloraseptic spray, suck on throat lozenges, hard candy or popsicles; gargle with warm salt water (1/4 tsp. salt per 8 oz. of water); and eat soft, bland foods. - Eat a well-balanced diet. If you cannot, ensure you are getting enough nutrients by taking a daily multivitamin. - Avoid dairy products, as they can thicken phlegm. - Avoid alcohol, as it impairs your body's immune system.  CONTACT YOUR DOCTOR IF YOU EXPERIENCE ANY OF THE FOLLOWING: - High fever - Ear pain - Sinus-type headache - Unusually severe cold symptoms - Cough that gets worse while other cold symptoms improve - Flare up of any chronic lung problem, such as asthma - Your symptoms persist longer than 2 weeks

## 2019-02-21 NOTE — Progress Notes (Signed)
Subjective: CC: Sore throat/ cough PCP: Dettinger, Fransisca Kaufmann, MD Teresa Patel is a 68 y.o. female presenting to clinic today for:  1. Sore throat/ cough Patient reports onset of sore throat, productive cough with clear sputum and mild hoarseness on Monday.  She notes that she is had multiple sick grandchildren.  No known contact with strep but she has been out and about in the community and may have had some exposure.  She is been using Mucinex, saline spray but symptoms are not improving substantially.  She also takes Claritin.  No associated nausea, vomiting, headache, fevers.  No recent travel or contacts with persons concerning for coronavirus.  2.  Urinary odor Patient goes on to state that she is had a urinary odor with associated increased urinary frequency for many weeks now.  She has been pushing oral fluids which she thought is what the cause of her urinary frequency was.  Denies any urgency, hematuria, pelvic pain or fevers.   ROS: Per HPI  No Known Allergies Past Medical History:  Diagnosis Date  . Anxiety   . GERD (gastroesophageal reflux disease)   . Hyperlipidemia   . Hypertension   . Shingles     Current Outpatient Medications:  .  atorvastatin (LIPITOR) 40 MG tablet, Take 1 tablet (40 mg total) by mouth daily at 6 PM. (Patient taking differently: Take 40 mg by mouth every other day. Takes 1 tablet every other day per patient), Disp: 90 tablet, Rfl: 3 .  co-enzyme Q-10 50 MG capsule, Take 50 mg by mouth daily., Disp: , Rfl:  .  ibuprofen (ADVIL,MOTRIN) 200 MG tablet, Take 200 mg by mouth as needed for moderate pain. , Disp: , Rfl:  .  lisinopril (PRINIVIL,ZESTRIL) 20 MG tablet, Take 1 tablet (20 mg total) by mouth daily. (Patient taking differently: Take 10 mg by mouth daily. 1/2 pill daily per patient), Disp: 90 tablet, Rfl: 3 .  loratadine (CLARITIN) 10 MG tablet, Take 10 mg by mouth daily as needed for allergies., Disp: , Rfl:  .  Nutritional Supplements  (GRAPESEED EXTRACT PO), Take 2 capsules by mouth daily. , Disp: , Rfl:  .  ranitidine (ZANTAC) 75 MG tablet, Take 75 mg by mouth daily as needed for heartburn., Disp: , Rfl:  .  sodium chloride (OCEAN) 0.65 % SOLN nasal spray, Place 1 spray into both nostrils as needed for congestion., Disp: , Rfl:  Social History   Socioeconomic History  . Marital status: Married    Spouse name: Not on file  . Number of children: Not on file  . Years of education: Not on file  . Highest education level: Not on file  Occupational History  . Not on file  Social Needs  . Financial resource strain: Not on file  . Food insecurity:    Worry: Not on file    Inability: Not on file  . Transportation needs:    Medical: Not on file    Non-medical: Not on file  Tobacco Use  . Smoking status: Passive Smoke Exposure - Never Smoker  . Smokeless tobacco: Never Used  Substance and Sexual Activity  . Alcohol use: No  . Drug use: No  . Sexual activity: Yes  Lifestyle  . Physical activity:    Days per week: Not on file    Minutes per session: Not on file  . Stress: Not on file  Relationships  . Social connections:    Talks on phone: Not on file  Gets together: Not on file    Attends religious service: Not on file    Active member of club or organization: Not on file    Attends meetings of clubs or organizations: Not on file    Relationship status: Not on file  . Intimate partner violence:    Fear of current or ex partner: Not on file    Emotionally abused: Not on file    Physically abused: Not on file    Forced sexual activity: Not on file  Other Topics Concern  . Not on file  Social History Narrative  . Not on file   Family History  Problem Relation Age of Onset  . Heart disease Mother   . Heart disease Father   . Cancer Father        prostate  . Cancer Brother        pancreatic  . Heart disease Brother        heart attack  . Heart disease Brother   . Diabetes Brother   . Kidney disease  Brother   . Cancer Brother        prostate  . Melanoma Daughter 23  . Cancer Maternal Grandmother        unknown  . Stroke Paternal Grandfather   . Colon cancer Neg Hx     Objective: Office vital signs reviewed. BP 138/86   Pulse 73   Temp 97.8 F (36.6 C) (Oral)   Ht 5\' 5"  (1.651 m)   Wt 167 lb (75.8 kg)   BMI 27.79 kg/m   Physical Examination:  General: Awake, alert, well nourished, No acute distress HEENT: Normal    Neck: No masses palpated. No lymphadenopathy    Ears: Tympanic membranes intact, normal light reflex, no erythema, no bulging    Eyes: PERRLA, extraocular membranes intact, sclera white    Nose: nasal turbinates moist, clear nasal discharge    Throat: moist mucus membranes, mild oropharyngeal erythema, no tonsillar exudate.  Airway is patent Cardio: regular rate and rhythm, S1S2 heard, no murmurs appreciated Pulm: clear to auscultation bilaterally, no wheezes, rhonchi or rales; normal work of breathing on room air  Assessment/ Plan: 68 y.o. female   1. Sore throat Rapid strep was negative.  This is likely a viral URI.  I have given her Magic mouthwash with lidocaine to gargle and spit every 6 hours as needed for sore throat.  Home care instructions reviewed the patient and handout was provided.  Reasons return discussed. - Rapid Strep Screen ([facility] ONLY) - Culture, Group A Strep - magic mouthwash w/lidocaine SOLN; Gargle and spit 70mL every 6 hours as needed for sore throat  Dispense: 240 mL; Refill: 0  2. Acute cystitis with hematuria Urinalysis with trace blood and 3+ leukocytes.  Urine microscopy with 11-30 white blood cells, 3-10 red blood cells, few bacteria and yeast present.  She was prescribed Diflucan for the yeast as well as Keflex for presumed UTI.  Home care instructions reviewed with the patient.  Urine culture sent.  We will contact her with results once available. - Urinalysis, Complete - fluconazole (DIFLUCAN) 150 MG tablet; Take 1  tablet (150 mg total) by mouth once for 1 dose.  Dispense: 1 tablet; Refill: 0 - Urine Culture  3. Vaginal yeast infection As above - cephALEXin (KEFLEX) 500 MG capsule; Take 1 capsule (500 mg total) by mouth 2 (two) times daily for 7 days.  Dispense: 14 capsule; Refill: 0   Orders Placed This  Encounter  Procedures  . Rapid Strep Screen ([facility] ONLY)  . Culture, Group A Strep    Order Specific Question:   Source    Answer:   throat  . Urine Culture  . Urinalysis, Complete   Meds ordered this encounter  Medications  . magic mouthwash w/lidocaine SOLN    Sig: Gargle and spit 70mL every 6 hours as needed for sore throat    Dispense:  240 mL    Refill:  0    33mL lidocaine in 416ml of [facility] MMW  . fluconazole (DIFLUCAN) 150 MG tablet    Sig: Take 1 tablet (150 mg total) by mouth once for 1 dose.    Dispense:  1 tablet    Refill:  0  . cephALEXin (KEFLEX) 500 MG capsule    Sig: Take 1 capsule (500 mg total) by mouth 2 (two) times daily for 7 days.    Dispense:  14 capsule    Refill:  [place], DO [place] [phone]

## 2019-02-23 LAB — CULTURE, GROUP A STREP: Strep A Culture: NEGATIVE

## 2019-02-23 LAB — URINE CULTURE

## 2019-02-24 DIAGNOSIS — F458 Other somatoform disorders: Secondary | ICD-10-CM | POA: Insufficient documentation

## 2019-02-24 DIAGNOSIS — R0683 Snoring: Secondary | ICD-10-CM | POA: Insufficient documentation

## 2019-02-24 NOTE — Procedures (Signed)
[place], [place] [address] NAME:  Teresa Patel                                                                       DOB: [birth year] MEDICAL RECORD No: [id number]                                               DOS: 02/16/2019 REFERRING PHYSICIAN: Caryl Pina, MD/ Richard Miu DDS STUDY PERFORMED: [facility] on [employer] HISTORY: Teresa Patel is a 68 y.o. female patient who is seen in a referral from Dr. Warrick Parisian via her dentist to evaluate if snoring and bruxism are related to OSA or not. Chief complaint according to patient:" My dentist wants to make the right appliance for me".  The patient is post -menopausal, has well controlled HTN and hyperlipidemia and was recently seen for a wellness check at PCP office. She has no rhinitis, no headaches, and breathes through the nose easily. She has retrognathia. She rarely has diaphoresis these days, no palpitations.  Epworth Sleepiness score: 11/ 24 points, Fatigue severity score: 16/ 63 points, BMI: 28.3 kg/m2   STUDY RESULTS:  Total Recording Time: 9 h14 mins; Calculated Sleep Time: 7 h 56 mins Total Apnea/Hypopnea Index (AHI): 4.7 /h; RDI: 5.8 /h; REM AHI: 8.2 /h Average Oxygen Saturation: 95 %; Lowest Oxygen Saturation:  88%  Total Time in Oxygen Saturation below 89 %: 0.2 minutes. Average Heart Rate: 63 bpm (between 53 and 85 bpm). IMPRESSION:  Very mild Sleep apnea, under the cut off for the diagnosis by AASM criteria. The patient has additional snoring related arousals, reflected in the RDI.  RECOMMENDATION: A mandibular advancement can treat snoring and this very mild apnea. CPAP is not indicated. I certify that I have reviewed the raw data recording prior to the issuance of this report in accordance with the standards of the American Academy of Sleep Medicine (AASM). Larey Seat, MD   02-23-2019 [facility] ([facility] Diplomat, ABPN (Neurology and Sleep)

## 2019-02-25 ENCOUNTER — Telehealth: Payer: Self-pay | Admitting: Neurology

## 2019-02-25 NOTE — Telephone Encounter (Signed)
Called the patient back. No answer. Advised the patient to call back.

## 2019-02-25 NOTE — Telephone Encounter (Signed)
Called patient to discuss sleep study results. No answer at this time. LVM for the patient to call back.   

## 2019-02-25 NOTE — Telephone Encounter (Signed)
Pt returned call. Please call as soon as available.

## 2019-02-25 NOTE — Telephone Encounter (Signed)
-----   Message from Larey Seat, MD sent at 02/24/2019  5:57 PM EDT ----- IMPRESSION: (AHI): 4.7 /h; RDI: 5.8 /h; REM AHI:  8.2 /hVery mild Sleep apnea, under the cut off for the  diagnosis by AASM criteria. The patient has additional snoring  related arousals, reflected in the RDI.  RECOMMENDATION: A mandibular advancement can treat snoring and  this very mild apnea. CPAP is not indicated.  Please cc: Dr Richard Miu, DDS and thank him for this referral.

## 2019-02-25 NOTE — Telephone Encounter (Signed)
Pt returned call and I was able to review the sleep study with her. Informed her that the apnea was mild and does not indicate a need for CPAP intervention. Advised that a dental device could be made to help with the treatment of mild sleep apnea and snoring but I am unsure with her apnea as mild as it was whether insurance covers it. Pt verbalized understanding. Patient requested copy of the sleep study be sent to the home address on file which was confirmed. Advised I would place in the mail and for her to show her dentist and get their thoughts on whether it worth her using. Pt verbalized understanding. Pt had no questions at this time but was encouraged to call back if questions arise.

## 2019-05-14 ENCOUNTER — Ambulatory Visit (INDEPENDENT_AMBULATORY_CARE_PROVIDER_SITE_OTHER): Payer: PPO | Admitting: Family Medicine

## 2019-05-14 ENCOUNTER — Other Ambulatory Visit: Payer: Self-pay

## 2019-05-14 DIAGNOSIS — R8290 Unspecified abnormal findings in urine: Secondary | ICD-10-CM

## 2019-05-14 MED ORDER — FLUCONAZOLE 150 MG PO TABS: 150.0000 mg | ORAL_TABLET | Freq: Once | ORAL | 0 refills | Status: AC

## 2019-05-14 MED ORDER — CEPHALEXIN 500 MG PO CAPS: 500.0000 mg | ORAL_CAPSULE | Freq: Two times a day (BID) | ORAL | 0 refills | Status: AC

## 2019-05-14 NOTE — Progress Notes (Signed)
Telephone visit  Subjective: CC: UTI? PCP: Dettinger, Fransisca Kaufmann, MD [id number] Teresa Patel is a 68 y.o. female calls for telephone consult today. Patient provides verbal consent for consult held via phone.  Location of patient: home Location of provider: [facility] Others present for call: none  1. Urinary symptoms Patient reports a 1 week h/o urinary odor that has a dark color.  She has been trying to drink more water.  Denies urinary frequency, urgency, hematuria, fevers, chills, abdominal pain, nausea, vomiting, back pain, vaginal discharge.  Patient was last treated for UTI in march.  ROS: Per HPI  No Known Allergies Past Medical History:  Diagnosis Date  . Anxiety   . GERD (gastroesophageal reflux disease)   . Hyperlipidemia   . Hypertension   . Shingles     Current Outpatient Medications:  .  atorvastatin (LIPITOR) 40 MG tablet, Take 1 tablet (40 mg total) by mouth daily at 6 PM. (Patient taking differently: Take 40 mg by mouth every other day. Takes 1 tablet every other day per patient), Disp: 90 tablet, Rfl: 3 .  co-enzyme Q-10 50 MG capsule, Take 50 mg by mouth daily., Disp: , Rfl:  .  ibuprofen (ADVIL,MOTRIN) 200 MG tablet, Take 200 mg by mouth as needed for moderate pain. , Disp: , Rfl:  .  lisinopril (PRINIVIL,ZESTRIL) 20 MG tablet, Take 1 tablet (20 mg total) by mouth daily. (Patient taking differently: Take 10 mg by mouth daily. 1/2 pill daily per patient), Disp: 90 tablet, Rfl: 3 .  loratadine (CLARITIN) 10 MG tablet, Take 10 mg by mouth daily as needed for allergies., Disp: , Rfl:  .  magic mouthwash w/lidocaine SOLN, Gargle and spit 28mL every 6 hours as needed for sore throat, Disp: 240 mL, Rfl: 0 .  Nutritional Supplements (GRAPESEED EXTRACT PO), Take 2 capsules by mouth daily. , Disp: , Rfl:  .  ranitidine (ZANTAC) 75 MG tablet, Take 75 mg by mouth daily as needed for heartburn., Disp: , Rfl:  .  sodium chloride (OCEAN) 0.65 % SOLN nasal spray, Place 1 spray into both  nostrils as needed for congestion., Disp: , Rfl:   Assessment/ Plan: 68 y.o. female   1. Abnormal urine odor I have proceeded with empiric treatment for acute cystitis with Keflex twice daily for 7 days and also sent in a Diflucan pill.  Her symptoms were very similar when she was evaluated in March and she was positive for a urinary tract infection at that time.  That urine grew pansensitive E. coli.  We discussed reasons for reevaluation.  She voiced good understanding will follow-up PRN - cephALEXin (KEFLEX) 500 MG capsule; Take 1 capsule (500 mg total) by mouth 2 (two) times daily for 7 days.  Dispense: 14 capsule; Refill: 0 - fluconazole (DIFLUCAN) 150 MG tablet; Take 1 tablet (150 mg total) by mouth once for 1 dose.  Dispense: 1 tablet; Refill: 0  Start time: 4:38pm End time: 4:45pm  Total time spent on patient care (including telephone call/ virtual visit): 15 minutes  [place], [place] [phone]

## 2019-09-04 ENCOUNTER — Other Ambulatory Visit: Payer: Self-pay

## 2019-09-05 ENCOUNTER — Encounter: Payer: Self-pay | Admitting: Family Medicine

## 2019-09-05 ENCOUNTER — Ambulatory Visit (INDEPENDENT_AMBULATORY_CARE_PROVIDER_SITE_OTHER): Payer: PPO | Admitting: Family Medicine

## 2019-09-05 VITALS — BP 130/89 | HR 68 | Temp 97.3°F | Resp 20 | Ht 65.0 in | Wt 164.4 lb

## 2019-09-05 DIAGNOSIS — K219 Gastro-esophageal reflux disease without esophagitis: Secondary | ICD-10-CM | POA: Diagnosis not present

## 2019-09-05 DIAGNOSIS — E782 Mixed hyperlipidemia: Secondary | ICD-10-CM

## 2019-09-05 DIAGNOSIS — I10 Essential (primary) hypertension: Secondary | ICD-10-CM | POA: Diagnosis not present

## 2019-09-05 MED ORDER — LISINOPRIL 20 MG PO TABS: 20.0000 mg | ORAL_TABLET | Freq: Every day | ORAL | 3 refills | Status: DC

## 2019-09-05 MED ORDER — ATORVASTATIN CALCIUM 40 MG PO TABS: 40.0000 mg | ORAL_TABLET | Freq: Every day | ORAL | 3 refills | Status: DC

## 2019-09-05 NOTE — Progress Notes (Signed)
BP 130/89   Pulse 68   Temp (!) 97.3 F (36.3 C) (Temporal)   Resp 20   Ht _0  (1.651 m)   Wt 164 lb 6.4 oz (74.6 kg)   SpO2 100%   BMI 27.36 kg/m    Subjective:   Patient ID: Teresa Patel, female    DOB: 1951/08/23, 68 y.o.   MRN: [id number]  HPI: Teresa Patel is a 68 y.o. female presenting on 09/05/2019 for Medical Management of Chronic Issues   HPI GERD Patient is currently on no medication currently, was on Zantac but does not need it right now and doing well.  Teresa Patel denies any major symptoms or abdominal pain or belching or burping. Teresa Patel denies any blood in her stool or lightheadedness or dizziness.   Hypertension Patient is currently on lisinopril 20, and their blood pressure today is 130/89. Patient denies any lightheadedness or dizziness. Patient denies headaches, blurred vision, chest pains, shortness of breath, or weakness. Denies any side effects from medication and is content with current medication.   Hyperlipidemia Patient is coming in for recheck of his hyperlipidemia. The patient is currently taking Lipitor. They deny any issues with myalgias or history of liver damage from it. They deny any focal numbness or weakness or chest pain.   Relevant past medical, surgical, family and social history reviewed and updated as indicated. Interim medical history since our last visit reviewed. Allergies and medications reviewed and updated.  Review of Systems  Constitutional: Negative for chills and fever.  HENT: Negative for congestion, ear discharge and ear pain.   Eyes: Negative for redness and visual disturbance.  Respiratory: Negative for chest tightness and shortness of breath.   Cardiovascular: Negative for chest pain and leg swelling.  Genitourinary: Negative for difficulty urinating and dysuria.  Musculoskeletal: Negative for back pain and gait problem.  Skin: Negative for rash.  Neurological: Negative for dizziness, light-headedness and headaches.   Psychiatric/Behavioral: Negative for agitation and behavioral problems.  All other systems reviewed and are negative.   Per HPI unless specifically indicated above   Allergies as of 09/05/2019   No Known Allergies     Medication List       Accurate as of September 05, 2019  9:58 AM. If you have any questions, ask your nurse or doctor.        STOP taking these medications   magic mouthwash w/lidocaine Soln Stopped by: Fransisca Kaufmann Marlisa Caridi, MD   ranitidine 75 MG tablet Commonly known as: ZANTAC Stopped by: Fransisca Kaufmann Khamarion Bjelland, MD   sodium chloride 0.65 % Soln nasal spray Commonly known as: OCEAN Stopped by: Worthy Rancher, MD     TAKE these medications   atorvastatin 40 MG tablet Commonly known as: LIPITOR Take 1 tablet (40 mg total) by mouth daily at 6 PM. What changed:   when to take this  additional instructions   co-enzyme Q-10 50 MG capsule Take 50 mg by mouth daily.   GRAPESEED EXTRACT PO Take 2 capsules by mouth daily.   ibuprofen 200 MG tablet Commonly known as: ADVIL Take 200 mg by mouth as needed for moderate pain.   lisinopril 20 MG tablet Commonly known as: ZESTRIL Take 1 tablet (20 mg total) by mouth daily. What changed:   how much to take  additional instructions   loratadine 10 MG tablet Commonly known as: CLARITIN Take 10 mg by mouth daily as needed for allergies.        Objective:  BP 130/89   Pulse 68   Temp (!) 97.3 F (36.3 C) (Temporal)   Resp 20   Ht _0  (1.651 m)   Wt 164 lb 6.4 oz (74.6 kg)   SpO2 100%   BMI 27.36 kg/m   Wt Readings from Last 3 Encounters:  09/05/19 164 lb 6.4 oz (74.6 kg)  02/21/19 167 lb (75.8 kg)  02/11/19 169 lb (76.7 kg)    Physical Exam Vitals signs and nursing note reviewed.  Constitutional:      General: Teresa Patel is not in acute distress.    Appearance: Teresa Patel is well-developed. Teresa Patel is not diaphoretic.  Eyes:     Conjunctiva/sclera: Conjunctivae normal.  Cardiovascular:     Rate and  Rhythm: Normal rate and regular rhythm.     Heart sounds: Normal heart sounds. No murmur.  Pulmonary:     Effort: Pulmonary effort is normal. No respiratory distress.     Breath sounds: Normal breath sounds. No wheezing.  Musculoskeletal: Normal range of motion.        General: No tenderness.  Skin:    General: Skin is warm and dry.     Findings: No rash.  Neurological:     Mental Status: Teresa Patel is alert and oriented to person, place, and time.     Coordination: Coordination normal.  Psychiatric:        Behavior: Behavior normal.       Assessment & Plan:   Problem List Items Addressed This Visit      Cardiovascular and Mediastinum   Hypertension   Relevant Medications   atorvastatin (LIPITOR) 40 MG tablet   lisinopril (ZESTRIL) 20 MG tablet   Other Relevant Orders   CMP14+EGFR     Digestive   GERD (gastroesophageal reflux disease)   Relevant Orders   CBC with Differential/Platelet     Other   Hyperlipidemia - Primary   Relevant Medications   atorvastatin (LIPITOR) 40 MG tablet   lisinopril (ZESTRIL) 20 MG tablet   Other Relevant Orders   Lipid panel      Continue Lipitor and lisinopril, will see back in 1 year and check blood work today, Teresa Patel does her mammograms elsewhere and Teresa Patel will come back for her flu and Tdap. Follow up plan: Return in about 1 year (around 09/04/2020), or if symptoms worsen or fail to improve, for Physical and cholesterol and hypertension recheck.  Counseling provided for all of the vaccine components Orders Placed This Encounter  Procedures  . CBC with Differential/Platelet  . CMP14+EGFR  . Lipid panel    Caryl Pina, MD [facility] 09/05/2019, 9:58 AM

## 2019-09-06 LAB — CBC WITH DIFFERENTIAL/PLATELET
Basophils Absolute: 0 10*3/uL (ref 0.0–0.2)
Basos: 1 %
EOS (ABSOLUTE): 0.1 10*3/uL (ref 0.0–0.4)
Eos: 1 %
Hematocrit: 43.7 % (ref 34.0–46.6)
Hemoglobin: 14.1 g/dL (ref 11.1–15.9)
Immature Grans (Abs): 0 10*3/uL (ref 0.0–0.1)
Immature Granulocytes: 0 %
Lymphocytes Absolute: 2.4 10*3/uL (ref 0.7–3.1)
Lymphs: 38 %
MCH: 29.1 pg (ref 26.6–33.0)
MCHC: 32.3 g/dL (ref 31.5–35.7)
MCV: 90 fL (ref 79–97)
Monocytes Absolute: 0.6 10*3/uL (ref 0.1–0.9)
Monocytes: 10 %
Neutrophils Absolute: 3.1 10*3/uL (ref 1.4–7.0)
Neutrophils: 50 %
Platelets: 259 10*3/uL (ref 150–450)
RBC: 4.85 x10E6/uL (ref 3.77–5.28)
RDW: 12.8 % (ref 11.7–15.4)
WBC: 6.2 10*3/uL (ref 3.4–10.8)

## 2019-09-06 LAB — CMP14+EGFR
ALT: 16 IU/L (ref 0–32)
AST: 19 IU/L (ref 0–40)
Albumin/Globulin Ratio: 1.4 (ref 1.2–2.2)
Albumin: 4.1 g/dL (ref 3.8–4.8)
Alkaline Phosphatase: 75 IU/L (ref 39–117)
BUN/Creatinine Ratio: 28 (ref 12–28)
BUN: 22 mg/dL (ref 8–27)
Bilirubin Total: 0.4 mg/dL (ref 0.0–1.2)
CO2: 26 mmol/L (ref 20–29)
Calcium: 10 mg/dL (ref 8.7–10.3)
Chloride: 104 mmol/L (ref 96–106)
Creatinine, Ser: 0.79 mg/dL (ref 0.57–1.00)
GFR calc Af Amer: 88 mL/min/{1.73_m2} (ref >59)
GFR calc non Af Amer: 76 mL/min/{1.73_m2} (ref >59)
Globulin, Total: 2.9 g/dL (ref 1.5–4.5)
Glucose: 95 mg/dL (ref 65–99)
Potassium: 4.3 mmol/L (ref 3.5–5.2)
Sodium: 141 mmol/L (ref 134–144)
Total Protein: 7 g/dL (ref 6.0–8.5)

## 2019-09-06 LAB — LIPID PANEL
Chol/HDL Ratio: 4.8 ratio — ABNORMAL HIGH (ref 0.0–4.4)
Cholesterol, Total: 209 mg/dL — ABNORMAL HIGH (ref 100–199)
HDL: 44 mg/dL (ref >39)
LDL Chol Calc (NIH): 122 mg/dL — ABNORMAL HIGH (ref 0–99)
Triglycerides: 246 mg/dL — ABNORMAL HIGH (ref 0–149)
VLDL Cholesterol Cal: 43 mg/dL — ABNORMAL HIGH (ref 5–40)

## 2019-09-30 DIAGNOSIS — Z1231 Encounter for screening mammogram for malignant neoplasm of breast: Secondary | ICD-10-CM | POA: Diagnosis not present

## 2019-10-23 ENCOUNTER — Ambulatory Visit (INDEPENDENT_AMBULATORY_CARE_PROVIDER_SITE_OTHER): Payer: PPO | Admitting: *Deleted

## 2019-10-23 DIAGNOSIS — Z0000 Encounter for general adult medical examination without abnormal findings: Secondary | ICD-10-CM

## 2019-10-23 NOTE — Patient Instructions (Signed)
Preventive Care 38 Years and Older, Female Preventive care refers to lifestyle choices and visits with your health care provider that can promote health and wellness. This includes:  A yearly physical exam. This is also called an annual well check.  Regular dental and eye exams.  Immunizations.  Screening for certain conditions.  Healthy lifestyle choices, such as diet and exercise. What can I expect for my preventive care visit? Physical exam Your health care provider will check:  Height and weight. These may be used to calculate body mass index (BMI), which is a measurement that tells if you are at a healthy weight.  Heart rate and blood pressure.  Your skin for abnormal spots. Counseling Your health care provider may ask you questions about:  Alcohol, tobacco, and drug use.  Emotional well-being.  Home and relationship well-being.  Sexual activity.  Eating habits.  History of falls.  Memory and ability to understand (cognition).  Work and work Statistician.  Pregnancy and menstrual history. What immunizations do I need?  Influenza (flu) vaccine  This is recommended every year. Tetanus, diphtheria, and pertussis (Tdap) vaccine  You may need a Td booster every 10 years. Varicella (chickenpox) vaccine  You may need this vaccine if you have not already been vaccinated. Zoster (shingles) vaccine  You may need this after age 70. Pneumococcal conjugate (PCV13) vaccine  One dose is recommended after age 70. Pneumococcal polysaccharide (PPSV23) vaccine  One dose is recommended after age 70. Measles, mumps, and rubella (MMR) vaccine  You may need at least one dose of MMR if you were born in 1957 or later. You may also need a second dose. Meningococcal conjugate (MenACWY) vaccine  You may need this if you have certain conditions. Hepatitis A vaccine  You may need this if you have certain conditions or if you travel or work in places where you may be exposed  to hepatitis A. Hepatitis B vaccine  You may need this if you have certain conditions or if you travel or work in places where you may be exposed to hepatitis B. Haemophilus influenzae type b (Hib) vaccine  You may need this if you have certain conditions. You may receive vaccines as individual doses or as more than one vaccine together in one shot (combination vaccines). Talk with your health care provider about the risks and benefits of combination vaccines. What tests do I need? Blood tests  Lipid and cholesterol levels. These may be checked every 5 years, or more frequently depending on your overall health.  Hepatitis C test.  Hepatitis B test. Screening  Lung cancer screening. You may have this screening every year starting at age 70 if you have a 30-pack-year history of smoking and currently smoke or have quit within the past 15 years.  Colorectal cancer screening. All adults should have this screening starting at age 70 and continuing until age 70. Your health care provider may recommend screening at age 23 if you are at increased risk at 70. You will have tests every 1-10 years, depending on your results and the type of screening test.  Diabetes screening. This is done by checking your blood sugar (glucose) after you have not eaten for a while (fasting). You may have this done every 1-3 years.  Mammogram. This may be done every 1-2 years. Talk with your health care provider about how often you should have regular mammograms.  BRCA-related cancer screening. This may be done if you have a family history of breast, ovarian, tubal, or peritoneal cancers.  Other tests  Sexually transmitted disease (STD) testing.  Bone density scan. This is done to screen for osteoporosis. You may have this done starting at age 70. Follow these instructions at home: Eating and drinking  Eat a diet that includes fresh fruits and vegetables, whole grains, lean protein, and low-fat dairy products. Limit  your intake of foods with high amounts of sugar, saturated fats, and salt.  Take vitamin and mineral supplements as recommended by your health care provider.  Do not drink alcohol if your health care provider tells you not to drink.  If you drink alcohol: ? Limit how much you have to 0-1 drink a day. ? Be aware of how much alcohol is in your drink. In the [place] one drink equals one 12 oz bottle of beer (355 mL), one 5 oz glass of wine (148 mL), or one 1 oz glass of hard liquor (44 mL). Lifestyle  Take daily care of your teeth and gums.  Stay active. Exercise for at least 30 minutes on 5 or more days each week.  Do not use any products that contain nicotine or tobacco, such as cigarettes, e-cigarettes, and chewing tobacco. If you need help quitting, ask your health care provider.  If you are sexually active, practice safe sex. Use a condom or other form of protection in order to prevent STIs (sexually transmitted infections).  Talk with your health care provider about taking a low-dose aspirin or statin. What's next?  Go to your health care provider once a year for a well check visit.  Ask your health care provider how often you should have your eyes and teeth checked.  Stay up to date on all vaccines. This information is not intended to replace advice given to you by your health care provider. Make sure you discuss any questions you have with your health care provider. Document Released: 12/31/2015 Document Revised: 11/28/2018 Document Reviewed: 11/28/2018 Elsevier Patient Education  2020 [employer].

## 2019-10-23 NOTE — Progress Notes (Signed)
MEDICARE ANNUAL WELLNESS VISIT  10/23/2019  Telephone Visit Disclaimer This Medicare AWV was conducted by telephone due to national recommendations for restrictions regarding the COVID-19 Pandemic (e.g. social distancing).  I verified, using two identifiers, that I am speaking with Scot Jun or their authorized healthcare agent. I discussed the limitations, risks, security, and privacy concerns of performing an evaluation and management service by telephone and the potential availability of an in-person appointment in the future. The patient expressed understanding and agreed to proceed.   Subjective:  Teresa Patel is a 68 y.o. female patient of Dettinger, Fransisca Kaufmann, MD who had a Medicare Annual Wellness Visit today via telephone. Teresa Patel is Retired and lives with their spouse. she has 3 children. she reports that she is socially active and does interact with friends/family regularly. she is moderately physically active and enjoys spending time with her 5 grandsons, cooking, gardening and traveling when they can.  Patient Care Team: Dettinger, Fransisca Kaufmann, MD as PCP - General (Family Medicine) Particia Nearing, [place] (Optometry) Rogene Houston, MD as Consulting Physician (Gastroenterology) Gari Crown, MD as Referring Physician (Obstetrics and Gynecology)  Advanced Directives 10/23/2019 10/15/2017 03/21/2017 03/13/2016  Does Patient Have a Medical Advance Directive? Yes Yes Yes Yes  Type of Paramedic of [place];Living will [place];Living will [place];Living will [place];Living will  Does patient want to make changes to medical advance directive? No - Patient declined No - Patient declined - No - Patient declined  Copy of [place] in Chart? No - copy requested No - copy requested No - copy requested No - copy requested    Hospital Utilization Over the Past 12 Months: # of  hospitalizations or ER visits: 0 # of surgeries: 0  Review of Systems    Patient reports that her overall health is unchanged compared to last year.  History obtained from chart review  Patient Reported Readings (BP, Pulse, CBG, Weight, etc) none  Pain Assessment Pain : No/denies pain     Current Medications & Allergies (verified) Allergies as of 10/23/2019   No Known Allergies     Medication List       Accurate as of October 23, 2019  9:38 AM. If you have any questions, ask your nurse or doctor.        atorvastatin 40 MG tablet Commonly known as: LIPITOR Take 1 tablet (40 mg total) by mouth daily at 6 PM.   co-enzyme Q-10 50 MG capsule Take 50 mg by mouth daily.   FISH OIL PO Take 540 mg by mouth daily.   GRAPESEED EXTRACT PO Take 2 capsules by mouth daily.   ibuprofen 200 MG tablet Commonly known as: ADVIL Take 200 mg by mouth as needed for moderate pain.   lisinopril 20 MG tablet Commonly known as: ZESTRIL Take 1 tablet (20 mg total) by mouth daily.   loratadine 10 MG tablet Commonly known as: CLARITIN Take 10 mg by mouth daily as needed for allergies.       History (reviewed): Past Medical History:  Diagnosis Date  . Anxiety   . GERD (gastroesophageal reflux disease)   . Hyperlipidemia   . Hypertension   . Shingles    Past Surgical History:  Procedure Laterality Date  . ABDOMINAL HYSTERECTOMY    . carpal tunnel Bilateral   . COLONOSCOPY N/A 03/21/2017   Procedure: COLONOSCOPY;  Surgeon: Rogene Houston, MD;  [facility];  Service: Endoscopy;  Laterality: N/A;  1030  . POLYPECTOMY  03/21/2017   Procedure: POLYPECTOMY;  Surgeon: Rogene Houston, MD;  Location: [facility];  Service: Endoscopy;;  colon   Family History  Problem Relation Age of Onset  . Heart disease Mother   . Heart disease Father   . Cancer Father        prostate  . Cancer Brother        pancreatic  . Heart disease Brother        heart attack  . Heart  disease Brother   . Diabetes Brother   . Kidney disease Brother   . Cancer Brother        prostate  . Melanoma Daughter 67  . Cancer Maternal Grandmother        unknown  . Stroke Paternal Grandfather   . Colon cancer Neg Hx    Social History   Socioeconomic History  . Marital status: Married    Spouse name: Mikeal Hawthorne  . Number of children: 3  . Years of education: 38  . Highest education level: High school graduate  Occupational History  . Occupation: Retired    Comment: Art gallery manager  Social Needs  . Financial resource strain: Not hard at all  . Food insecurity    Worry: Never true    Inability: Never true  . Transportation needs    Medical: No    Non-medical: No  Tobacco Use  . Smoking status: Passive Smoke Exposure - Never Smoker  . Smokeless tobacco: Never Used  Substance and Sexual Activity  . Alcohol use: No  . Drug use: No  . Sexual activity: Yes  Lifestyle  . Physical activity    Days per week: 4 days    Minutes per session: 50 min  . Stress: Not at all  Relationships  . Social connections    Talks on phone: More than three times a week    Gets together: More than three times a week    Attends religious service: More than 4 times per year    Active member of club or organization: Yes    Attends meetings of clubs or organizations: More than 4 times per year    Relationship status: Married  Other Topics Concern  . Not on file  Social History Narrative  . Not on file    Activities of Daily Living In your present state of health, do you have any difficulty performing the following activities: 10/23/2019  Hearing? N  Vision? N  Comment wears glasses-gets eye exam every 2 years  Difficulty concentrating or making decisions? N  Walking or climbing stairs? N  Dressing or bathing? N  Doing errands, shopping? N  Preparing Food and eating ? N  Using the Toilet? N  In the past six months, have you accidently leaked urine? N  Do you have problems with  loss of bowel control? N  Managing your Medications? N  Managing your Finances? N  Housekeeping or managing your Housekeeping? N  Some recent data might be hidden    Patient Education/ Literacy How often do you need to have someone help you when you read instructions, pamphlets, or other written materials from your doctor or pharmacy?: 1 - Never What is the last grade level you completed in school?: 12th grade  Exercise Current Exercise Habits: Home exercise routine, Type of exercise: walking, Time (Minutes): 45, Frequency (Times/Week): 4, Weekly Exercise (Minutes/Week): 180, Intensity: Moderate, Exercise limited by:  None identified  Diet Patient reports consuming 3 meals a day and 0 snack(s) a day Patient reports that her primary diet is: Regular Patient reports that she does have regular access to food.   Depression Screen PHQ 2/9 Scores 10/23/2019 09/05/2019 02/21/2019 10/21/2018 10/15/2017 08/29/2017 04/11/2017  PHQ - 2 Score 0 0 0 0 0 0 0  PHQ- 9 Score - - 0 - - - -     Fall Risk Fall Risk  10/23/2019 09/05/2019 02/21/2019 10/21/2018 10/15/2017  Falls in the past year? 0 0 0 0 No  Number falls in past yr: - - - 0 -     Objective:  [place] seemed alert and oriented and she participated appropriately during our telephone visit.  Blood Pressure Weight BMI  BP Readings from Last 3 Encounters:  09/05/19 130/89  02/21/19 138/86  02/11/19 138/89   Wt Readings from Last 3 Encounters:  09/05/19 164 lb 6.4 oz (74.6 kg)  02/21/19 167 lb (75.8 kg)  02/11/19 169 lb (76.7 kg)   BMI Readings from Last 1 Encounters:  09/05/19 27.36 kg/m    *Unable to obtain current vital signs, weight, and BMI due to telephone visit type  Hearing/Vision  . [place] did not seem to have difficulty with hearing/understanding during the telephone conversation . Reports that she has not had a formal eye exam by an eye care professional within the past year . Reports that she has not had a formal hearing  evaluation within the past year *Unable to fully assess hearing and vision during telephone visit type  Cognitive Function: 6CIT Screen 10/23/2019 10/21/2018  What Year? 0 points 0 points  What month? 0 points 0 points  What time? 0 points 0 points  Count back from 20 0 points 0 points  Months in reverse 0 points 0 points  Repeat phrase 0 points 0 points  Total Score 0 0   (Normal:0-7, Significant for Dysfunction: >8)  Normal Cognitive Function Screening: Yes   Immunization & Health Maintenance Record Immunization History  Administered Date(s) Administered  . Influenza Whole 10/25/2012  . Influenza, High Dose Seasonal PF 10/06/2016, 10/15/2017, 10/21/2018  . Influenza,inj,Quad PF,6+ Mos 11/28/2013, 10/27/2014, 10/19/2015  . Pneumococcal Conjugate-13 07/14/2014, 04/11/2017  . Pneumococcal Polysaccharide-23 07/29/2015  . Td 01/27/2008    Health Maintenance  Topic Date Due  . TETANUS/TDAP  01/26/2018  . MAMMOGRAM  05/19/2019  . INFLUENZA VACCINE  07/19/2019  . DEXA SCAN  11/04/2021  . COLONOSCOPY  03/22/2027  . Hepatitis C Screening  Completed  . PNA vac Low Risk Adult  Completed       Assessment  This is a routine wellness examination for [employer].  Health Maintenance: Due or Overdue Health Maintenance Due  Topic Date Due  . TETANUS/TDAP  01/26/2018  . MAMMOGRAM  05/19/2019  . INFLUENZA VACCINE  07/19/2019    Yesica G Vantil does not need a referral for Community Assistance: Care Management:   no Social Work:    no Prescription Assistance:  no Nutrition/Diabetes Education:  no   Plan:  Personalized Goals Goals Addressed            This Visit's Progress   . DIET - EAT MORE FRUITS AND VEGETABLES        Personalized Health Maintenance & Screening Recommendations  Influenza vaccine Td vaccine Advanced directives: has an advanced directive - a copy HAS NOT been provided. Shingles vaccine  Lung Cancer Screening Recommended: no (Low Dose CT Chest  recommended  if Age 56-80 years, 30 pack-year currently smoking OR have quit w/in past 15 years) Hepatitis C Screening recommended: no HIV Screening recommended: no  Advanced Directives: Written information was not prepared per patient's request.  Referrals & Orders No orders of the defined types were placed in this encounter.   Follow-up Plan . Follow-up with Dettinger, Fransisca Kaufmann, MD as planned . Consider Flu, Shingles and TDAP vaccines  . Bring a copy of your Advanced Directives in for our records   I have personally reviewed and noted the following in the patient's chart:   . Medical and social history . Use of alcohol, tobacco or illicit drugs  . Current medications and supplements . Functional ability and status . Nutritional status . Physical activity . Advanced directives . List of other physicians . Hospitalizations, surgeries, and ER visits in previous 12 months . Vitals . Screenings to include cognitive, depression, and falls . Referrals and appointments  In addition, I have reviewed and discussed with Teresa Patel certain preventive protocols, quality metrics, and best practice recommendations. A written personalized care plan for preventive services as well as general preventive health recommendations is available and can be mailed to the patient at her request.      Milas Hock, LPN  [id number]

## 2019-10-30 DIAGNOSIS — H524 Presbyopia: Secondary | ICD-10-CM | POA: Diagnosis not present

## 2020-03-02 ENCOUNTER — Telehealth: Payer: Self-pay | Admitting: Family Medicine

## 2020-03-02 NOTE — Chronic Care Management (AMB) (Signed)
  Chronic Care Management   Note  03/02/2020 Name: Teresa Patel MRN: [id number] DOB: Sep 22, 1951  Teresa Patel is a 69 y.o. year old female who is a primary care patient of Dettinger, Fransisca Kaufmann, MD. I reached out to Scot Jun by phone today in response to a referral sent by [name] Noecker's health plan.     [name] was given information about Chronic Care Management services today including:  1. CCM service includes personalized support from designated clinical staff supervised by her physician, including individualized plan of care and coordination with other care providers 2. 24/7 contact phone numbers for assistance for urgent and routine care needs. 3. Service will only be billed when office clinical staff spend 20 minutes or more in a month to coordinate care. 4. Only one practitioner may furnish and bill the service in a calendar month. 5. The patient may stop CCM services at any time (effective at the end of the month) by phone call to the office staff. 6. The patient will be responsible for cost sharing (co-pay) of up to 20% of the service fee (after annual deductible is met).  Patient did not agree to enrollment in care management services and does not wish to consider at this time.  Follow up plan: The patient has been provided with contact information for the care management team and has been advised to call with any health related questions or concerns.   [place], [place] [address] Direct Dial: [phone] [email or web address]: [email or web address]

## 2020-03-02 NOTE — Chronic Care Management (AMB) (Signed)
  Chronic Care Management   Outreach Note  03/02/2020 Name: Teresa Patel MRN: [id number] DOB: 15-Apr-1951  Teresa Patel is a 68 y.o. year old female who is a primary care patient of Dettinger, Fransisca Kaufmann, MD. I reached out to Scot Jun by phone today in response to a referral sent by Ms. [name]'s health plan.     An unsuccessful telephone outreach was attempted today. The patient was referred to the case management team for assistance with care management and care coordination.   Follow Up Plan: A HIPPA compliant phone message was left for the patient providing contact information and requesting a return call. The care management team will reach out to the patient again over the next 7 days. If patient returns call to provider office, please advise to call [place] at [phone].  [place], [place] [address] Direct Dial: [phone] [email or web address]

## 2020-04-15 DIAGNOSIS — M25562 Pain in left knee: Secondary | ICD-10-CM | POA: Diagnosis not present

## 2020-04-15 DIAGNOSIS — M238X2 Other internal derangements of left knee: Secondary | ICD-10-CM | POA: Diagnosis not present

## 2020-04-15 DIAGNOSIS — M2392 Unspecified internal derangement of left knee: Secondary | ICD-10-CM | POA: Diagnosis not present

## 2020-08-26 ENCOUNTER — Ambulatory Visit (INDEPENDENT_AMBULATORY_CARE_PROVIDER_SITE_OTHER): Payer: PPO | Admitting: Family Medicine

## 2020-08-26 ENCOUNTER — Encounter: Payer: Self-pay | Admitting: Family Medicine

## 2020-08-26 ENCOUNTER — Other Ambulatory Visit: Payer: Self-pay

## 2020-08-26 VITALS — BP 123/78 | HR 75 | Temp 98.0°F | Ht 65.0 in | Wt 158.5 lb

## 2020-08-26 DIAGNOSIS — K219 Gastro-esophageal reflux disease without esophagitis: Secondary | ICD-10-CM | POA: Diagnosis not present

## 2020-08-26 DIAGNOSIS — E782 Mixed hyperlipidemia: Secondary | ICD-10-CM | POA: Diagnosis not present

## 2020-08-26 DIAGNOSIS — I10 Essential (primary) hypertension: Secondary | ICD-10-CM

## 2020-08-26 MED ORDER — ATORVASTATIN CALCIUM 40 MG PO TABS: 40.0000 mg | ORAL_TABLET | Freq: Every day | ORAL | 3 refills | Status: DC

## 2020-08-26 MED ORDER — LISINOPRIL 20 MG PO TABS: 20.0000 mg | ORAL_TABLET | Freq: Every day | ORAL | 3 refills | Status: DC

## 2020-08-26 NOTE — Progress Notes (Signed)
BP 123/78   Pulse 75   Temp 98 F (36.7 C)   Ht '5\' 5"'  (1.651 m)   Wt 158 lb 8 oz (71.9 kg)   SpO2 98%   BMI 26.38 kg/m    Subjective:   Patient ID: Teresa Patel, female    DOB: [birth year], 69 y.o.   MRN: [id number]  HPI: Teresa Patel is a 69 y.o. female presenting on 08/26/2020 for Medical Management of Chronic Issues and Hypertension   HPI Hypertension Patient is currently on lisinopril, and their blood pressure today is 123/78. Patient denies any lightheadedness or dizziness. Patient denies headaches, blurred vision, chest pains, shortness of breath, or weakness. Denies any side effects from medication and is content with current medication.   Hyperlipidemia Patient is coming in for recheck of his hyperlipidemia. The patient is currently taking atorvastatin. They deny any issues with myalgias or history of liver damage from it. They deny any focal numbness or weakness or chest pain.   GERD Patient is currently on no medication currently, has been doing well.  She denies any major symptoms or abdominal pain or belching or burping. She denies any blood in her stool or lightheadedness or dizziness.   Relevant past medical, surgical, family and social history reviewed and updated as indicated. Interim medical history since our last visit reviewed. Allergies and medications reviewed and updated.  Review of Systems  Constitutional: Negative for chills and fever.  HENT: Negative for congestion, ear discharge and ear pain.   Eyes: Negative for redness and visual disturbance.  Respiratory: Negative for chest tightness and shortness of breath.   Cardiovascular: Negative for chest pain and leg swelling.  Genitourinary: Negative for difficulty urinating and dysuria.  Musculoskeletal: Negative for back pain and gait problem.  Skin: Negative for rash.  Neurological: Negative for light-headedness and headaches.  Psychiatric/Behavioral: Negative for agitation and behavioral problems.   All other systems reviewed and are negative.   Per HPI unless specifically indicated above   Allergies as of 08/26/2020   No Known Allergies     Medication List       Accurate as of August 26, 2020  3:59 PM. If you have any questions, ask your nurse or doctor.        STOP taking these medications   GRAPESEED EXTRACT PO Stopped by: Fransisca Kaufmann Triva Hueber, MD     TAKE these medications   atorvastatin 40 MG tablet Commonly known as: LIPITOR Take 1 tablet (40 mg total) by mouth daily at 6 PM.   co-enzyme Q-10 50 MG capsule Take 50 mg by mouth daily.   FISH OIL PO Take 540 mg by mouth daily.   ibuprofen 200 MG tablet Commonly known as: ADVIL Take 200 mg by mouth as needed for moderate pain.   lisinopril 20 MG tablet Commonly known as: ZESTRIL Take 1 tablet (20 mg total) by mouth daily.   loratadine 10 MG tablet Commonly known as: CLARITIN Take 10 mg by mouth daily as needed for allergies.        Objective:   BP 123/78   Pulse 75   Temp 98 F (36.7 C)   Ht '5\' 5"'  (1.651 m)   Wt 158 lb 8 oz (71.9 kg)   SpO2 98%   BMI 26.38 kg/m   Wt Readings from Last 3 Encounters:  08/26/20 158 lb 8 oz (71.9 kg)  09/05/19 164 lb 6.4 oz (74.6 kg)  02/21/19 167 lb (75.8 kg)    Physical Exam  Vitals and nursing note reviewed.  Constitutional:      General: She is not in acute distress.    Appearance: She is well-developed. She is not diaphoretic.  Eyes:     Conjunctiva/sclera: Conjunctivae normal.  Cardiovascular:     Rate and Rhythm: Normal rate and regular rhythm.     Heart sounds: Normal heart sounds. No murmur heard.   Pulmonary:     Effort: Pulmonary effort is normal. No respiratory distress.     Breath sounds: Normal breath sounds. No wheezing.  Musculoskeletal:        General: No tenderness. Normal range of motion.  Skin:    General: Skin is warm and dry.     Findings: No rash.  Neurological:     Mental Status: She is alert and oriented to person, place,  and time.     Coordination: Coordination normal.  Psychiatric:        Behavior: Behavior normal.     Results for orders placed or performed in visit on 09/05/19  CBC with Differential/Platelet  Result Value Ref Range   WBC 6.2 3.4 - 10.8 x10E3/uL   RBC 4.85 3.77 - 5.28 x10E6/uL   Hemoglobin 14.1 11.1 - 15.9 g/dL   Hematocrit 43.7 34.0 - 46.6 %   MCV 90 79 - 97 fL   MCH 29.1 26.6 - 33.0 pg   MCHC 32.3 31 - 35 g/dL   RDW 12.8 11.7 - 15.4 %   Platelets 259 150 - 450 x10E3/uL   Neutrophils 50 Not Estab. %   Lymphs 38 Not Estab. %   Monocytes 10 Not Estab. %   Eos 1 Not Estab. %   Basos 1 Not Estab. %   Neutrophils Absolute 3.1 1 - 7 x10E3/uL   Lymphocytes Absolute 2.4 0 - 3 x10E3/uL   Monocytes Absolute 0.6 0 - 0 x10E3/uL   EOS (ABSOLUTE) 0.1 0.0 - 0.4 x10E3/uL   Basophils Absolute 0.0 0 - 0 x10E3/uL   Immature Granulocytes 0 Not Estab. %   Immature Grans (Abs) 0.0 0.0 - 0.1 x10E3/uL  CMP14+EGFR  Result Value Ref Range   Glucose 95 65 - 99 mg/dL   BUN 22 8 - 27 mg/dL   Creatinine, Ser 0.79 0.57 - 1.00 mg/dL   GFR calc non Af Amer 76 >59 mL/min/1.73   GFR calc Af Amer 88 >59 mL/min/1.73   BUN/Creatinine Ratio 28 12 - 28   Sodium 141 134 - 144 mmol/L   Potassium 4.3 3.5 - 5.2 mmol/L   Chloride 104 96 - 106 mmol/L   CO2 26 20 - 29 mmol/L   Calcium 10.0 8.7 - 10.3 mg/dL   Total Protein 7.0 6.0 - 8.5 g/dL   Albumin 4.1 3.8 - 4.8 g/dL   Globulin, Total 2.9 1.5 - 4.5 g/dL   Albumin/Globulin Ratio 1.4 1.2 - 2.2   Bilirubin Total 0.4 0.0 - 1.2 mg/dL   Alkaline Phosphatase 75 39 - 117 IU/L   AST 19 0 - 40 IU/L   ALT 16 0 - 32 IU/L  Lipid panel  Result Value Ref Range   Cholesterol, Total 209 (H) 100 - 199 mg/dL   Triglycerides 246 (H) 0 - 149 mg/dL   HDL 44 >39 mg/dL   VLDL Cholesterol Cal 43 (H) 5 - 40 mg/dL   LDL Chol Calc (NIH) 122 (H) 0 - 99 mg/dL   Chol/HDL Ratio 4.8 (H) 0.0 - 4.4 ratio    Assessment & Plan:   Problem List Items  Addressed This Visit       Cardiovascular and Mediastinum   Hypertension - Primary   Relevant Medications   atorvastatin (LIPITOR) 40 MG tablet   lisinopril (ZESTRIL) 20 MG tablet   Other Relevant Orders   CMP14+EGFR     Digestive   GERD (gastroesophageal reflux disease)   Relevant Orders   CBC with Differential/Platelet     Other   Hyperlipidemia   Relevant Medications   atorvastatin (LIPITOR) 40 MG tablet   lisinopril (ZESTRIL) 20 MG tablet   Other Relevant Orders   Lipid panel      Doing well, continue current medication. Follow up plan: Return in about 1 year (around 08/26/2021), or if symptoms worsen or fail to improve.  Counseling provided for all of the vaccine components No orders of the defined types were placed in this encounter.   Caryl Pina, MD [facility] 08/26/2020, 3:59 PM

## 2020-08-27 LAB — CMP14+EGFR
ALT: 19 IU/L (ref 0–32)
AST: 22 IU/L (ref 0–40)
Albumin/Globulin Ratio: 1.6 (ref 1.2–2.2)
Albumin: 4.5 g/dL (ref 3.7–4.7)
Alkaline Phosphatase: 77 IU/L (ref 48–121)
BUN/Creatinine Ratio: 19 (ref 12–28)
BUN: 15 mg/dL (ref 8–27)
Bilirubin Total: 0.6 mg/dL (ref 0.0–1.2)
CO2: 25 mmol/L (ref 20–29)
Calcium: 10.3 mg/dL (ref 8.7–10.3)
Chloride: 103 mmol/L (ref 96–106)
Creatinine, Ser: 0.8 mg/dL (ref 0.57–1.00)
GFR calc Af Amer: 86 mL/min/{1.73_m2} (ref >59)
GFR calc non Af Amer: 74 mL/min/{1.73_m2} (ref >59)
Globulin, Total: 2.9 g/dL (ref 1.5–4.5)
Glucose: 89 mg/dL (ref 65–99)
Potassium: 4.6 mmol/L (ref 3.5–5.2)
Sodium: 141 mmol/L (ref 134–144)
Total Protein: 7.4 g/dL (ref 6.0–8.5)

## 2020-08-27 LAB — CBC WITH DIFFERENTIAL/PLATELET
Basophils Absolute: 0.1 10*3/uL (ref 0.0–0.2)
Basos: 1 %
EOS (ABSOLUTE): 0.1 10*3/uL (ref 0.0–0.4)
Eos: 1 %
Hematocrit: 44.1 % (ref 34.0–46.6)
Hemoglobin: 15.1 g/dL (ref 11.1–15.9)
Immature Grans (Abs): 0 10*3/uL (ref 0.0–0.1)
Immature Granulocytes: 0 %
Lymphocytes Absolute: 2.8 10*3/uL (ref 0.7–3.1)
Lymphs: 40 %
MCH: 31.3 pg (ref 26.6–33.0)
MCHC: 34.2 g/dL (ref 31.5–35.7)
MCV: 91 fL (ref 79–97)
Monocytes Absolute: 0.8 10*3/uL (ref 0.1–0.9)
Monocytes: 11 %
Neutrophils Absolute: 3.3 10*3/uL (ref 1.4–7.0)
Neutrophils: 47 %
Platelets: 302 10*3/uL (ref 150–450)
RBC: 4.83 x10E6/uL (ref 3.77–5.28)
RDW: 12.4 % (ref 11.7–15.4)
WBC: 7 10*3/uL (ref 3.4–10.8)

## 2020-08-27 LAB — LIPID PANEL
Chol/HDL Ratio: 3.7 ratio (ref 0.0–4.4)
Cholesterol, Total: 202 mg/dL — ABNORMAL HIGH (ref 100–199)
HDL: 55 mg/dL (ref >39)
LDL Chol Calc (NIH): 120 mg/dL — ABNORMAL HIGH (ref 0–99)
Triglycerides: 153 mg/dL — ABNORMAL HIGH (ref 0–149)
VLDL Cholesterol Cal: 27 mg/dL (ref 5–40)

## 2020-09-15 ENCOUNTER — Telehealth: Payer: Self-pay | Admitting: Family Medicine

## 2020-09-15 NOTE — Telephone Encounter (Signed)
Patient notified of Tdap date and verbalized understanding

## 2020-10-14 ENCOUNTER — Other Ambulatory Visit: Payer: Self-pay | Admitting: Family Medicine

## 2020-10-14 DIAGNOSIS — Z1231 Encounter for screening mammogram for malignant neoplasm of breast: Secondary | ICD-10-CM

## 2020-10-15 ENCOUNTER — Other Ambulatory Visit: Payer: Self-pay

## 2020-10-15 ENCOUNTER — Ambulatory Visit
Admission: RE | Admit: 2020-10-15 | Discharge: 2020-10-15 | Disposition: A | Discharge status: Home / Self Care | Payer: PPO | Source: Ambulatory Visit | Attending: Unknown Physician Specialty | Admitting: Unknown Physician Specialty

## 2020-10-15 DIAGNOSIS — Z1231 Encounter for screening mammogram for malignant neoplasm of breast: Secondary | ICD-10-CM

## 2020-11-24 ENCOUNTER — Ambulatory Visit (INDEPENDENT_AMBULATORY_CARE_PROVIDER_SITE_OTHER): Payer: PPO | Admitting: *Deleted

## 2020-11-24 DIAGNOSIS — Z0000 Encounter for general adult medical examination without abnormal findings: Secondary | ICD-10-CM | POA: Diagnosis not present

## 2020-11-24 NOTE — Progress Notes (Addendum)
MEDICARE ANNUAL WELLNESS VISIT  11/24/2020  Telephone Visit Disclaimer This Medicare AWV was conducted by telephone due to national recommendations for restrictions regarding the COVID-19 Pandemic (e.g. social distancing).  I verified, using two identifiers, that I am speaking with Scot Jun or their authorized healthcare agent. I discussed the limitations, risks, security, and privacy concerns of performing an evaluation and management service by telephone and the potential availability of an in-person appointment in the future. The patient expressed understanding and agreed to proceed.  Location of Patient: Home  Location of Provider (nurse):  [facility]  Subjective:    HARLEAN REGULA is a 69 y.o. female patient of Dettinger, Fransisca Kaufmann, MD who had a Medicare Annual Wellness Visit today via telephone. Vetta is Retired and lives with their spouse. she has 3 children. she reports that she is socially active and does interact with friends/family regularly. she is minimally physically active and enjoys gardening, spending time with grandchildren and traveling.  Patient Care Team: Dettinger, Fransisca Kaufmann, MD as PCP - General (Family Medicine) Rogene Houston, MD as Consulting Physician (Gastroenterology) Gari Crown, MD as Referring Physician (Obstetrics and Gynecology) Leticia Clas, [place] (Optometry)  Advanced Directives 11/24/2020 10/23/2019 10/15/2017 03/21/2017 03/13/2016  Does Patient Have a Medical Advance Directive? Yes Yes Yes Yes Yes  Type of Paramedic of [place];Living will [place];Living will [place];Living will [place];Living will [place];Living will  Does patient want to make changes to medical advance directive? - No - Patient declined No - Patient declined - No - Patient declined  Copy of [place] in Chart? No - copy requested  No - copy requested No - copy requested No - copy requested No - copy requested    Hospital Utilization Over the Past 12 Months: # of hospitalizations or ER visits: 0 # of surgeries: 0  Review of Systems    Patient reports that her overall health is unchanged compared to last year.  History obtained from the patient and chart review.  Patient Reported Readings (BP, Pulse, CBG, Weight, etc) none  Pain Assessment Pain : No/denies pain     Current Medications & Allergies (verified) Allergies as of 11/24/2020   No Known Allergies     Medication List       Accurate as of November 24, 2020 10:07 AM. If you have any questions, ask your nurse or doctor.        atorvastatin 40 MG tablet Commonly known as: LIPITOR Take 1 tablet (40 mg total) by mouth daily at 6 PM.   calcium-vitamin D 500-200 MG-UNIT Tabs tablet Commonly known as: OSCAL WITH D Take by mouth.   co-enzyme Q-10 50 MG capsule Take 50 mg by mouth daily.   FISH OIL PO Take 540 mg by mouth daily.   ibuprofen 200 MG tablet Commonly known as: ADVIL Take 200 mg by mouth as needed for moderate pain.   lisinopril 20 MG tablet Commonly known as: ZESTRIL Take 1 tablet (20 mg total) by mouth daily.   loratadine 10 MG tablet Commonly known as: CLARITIN Take 10 mg by mouth daily as needed for allergies.       History (reviewed): Past Medical History:  Diagnosis Date  . Anxiety   . GERD (gastroesophageal reflux disease)   . Hyperlipidemia   . Hypertension   . Shingles    Past Surgical History:  Procedure Laterality Date  .  ABDOMINAL HYSTERECTOMY    . carpal tunnel Bilateral   . COLONOSCOPY N/A 03/21/2017   Procedure: COLONOSCOPY;  Surgeon: Rogene Houston, MD;  [facility]: [facility];  Service: Endoscopy;  Laterality: N/A;  1030  . POLYPECTOMY  03/21/2017   Procedure: POLYPECTOMY;  Surgeon: Rogene Houston, MD;  Location: [facility];  Service: Endoscopy;;  colon   Family History  Problem Relation Age  of Onset  . Heart disease Mother   . Heart disease Father   . Cancer Father        prostate  . Cancer Brother        pancreatic  . Heart disease Brother        heart attack  . Heart disease Brother   . Diabetes Brother   . Kidney disease Brother   . Cancer Brother        prostate  . Stroke Sister   . Melanoma Daughter 87  . Cancer Maternal Grandmother        unknown  . Stroke Paternal Grandfather   . Colon cancer Neg Hx    Social History   Socioeconomic History  . Marital status: Married    Spouse name: Mikeal Hawthorne  . Number of children: 3  . Years of education: 59  . Highest education level: High school graduate  Occupational History  . Occupation: Retired    Comment: Art gallery manager  Tobacco Use  . Smoking status: Passive Smoke Exposure - Never Smoker  . Smokeless tobacco: Never Used  Vaping Use  . Vaping Use: Never used  Substance and Sexual Activity  . Alcohol use: No  . Drug use: No  . Sexual activity: Yes  Other Topics Concern  . Not on file  Social History Narrative  . Not on file   Social Determinants of Health   Financial Resource Strain:   . Difficulty of Paying Living Expenses: Not on file  Food Insecurity:   . Worried About Charity fundraiser in the Last Year: Not on file  . Ran Out of Food in the Last Year: Not on file  Transportation Needs:   . Lack of Transportation (Medical): Not on file  . Lack of Transportation (Non-Medical): Not on file  Physical Activity:   . Days of Exercise per Week: Not on file  . Minutes of Exercise per Session: Not on file  Stress:   . Feeling of Stress : Not on file  Social Connections:   . Frequency of Communication with Friends and Family: Not on file  . Frequency of Social Gatherings with Friends and Family: Not on file  . Attends Religious Services: Not on file  . Active Member of Clubs or Organizations: Not on file  . Attends Archivist Meetings: Not on file  . Marital Status: Not on file     Activities of Daily Living In your present state of health, do you have any difficulty performing the following activities: 11/24/2020  Hearing? N  Vision? Y  Comment wears glasses  Difficulty concentrating or making decisions? N  Walking or climbing stairs? N  Dressing or bathing? N  Doing errands, shopping? N  Preparing Food and eating ? N  Using the Toilet? N  In the past six months, have you accidently leaked urine? N  Do you have problems with loss of bowel control? N  Managing your Medications? N  Managing your Finances? N  Housekeeping or managing your Housekeeping? N  Some recent data might be hidden  Patient Education/ Literacy How often do you need to have someone help you when you read instructions, pamphlets, or other written materials from your doctor or pharmacy?: 1 - Never  Exercise Current Exercise Habits: The patient has a physically strenuous job, but has no regular exercise apart from work., Exercise limited by: None identified  Diet Patient reports consuming 2 meals a day and 1 snack(s) a day Patient reports that her primary diet is: Regular Patient reports that she does have regular access to food.   Depression Screen PHQ 2/9 Scores 11/24/2020 08/26/2020 10/23/2019 09/05/2019 02/21/2019 10/21/2018 10/15/2017  PHQ - 2 Score 0 0 0 0 0 0 0  PHQ- 9 Score - - - - 0 - -     Fall Risk Fall Risk  11/24/2020 08/26/2020 10/23/2019 09/05/2019 02/21/2019  Falls in the past year? 1 1 0 0 0  Number falls in past yr: 0 0 - - -  Injury with Fall? 0 0 - - -  Risk for fall due to : No Fall Risks Impaired balance/gait - - -  Follow up - Falls evaluation completed - - -     Objective:  [place] seemed alert and oriented and she participated appropriately during our telephone visit.  Blood Pressure Weight BMI  BP Readings from Last 3 Encounters:  08/26/20 123/78  09/05/19 130/89  02/21/19 138/86   Wt Readings from Last 3 Encounters:  08/26/20 158 lb 8 oz (71.9 kg)   09/05/19 164 lb 6.4 oz (74.6 kg)  02/21/19 167 lb (75.8 kg)   BMI Readings from Last 1 Encounters:  08/26/20 26.38 kg/m    *Unable to obtain current vital signs, weight, and BMI due to telephone visit type  Hearing/Vision  . Kenny did not seem to have difficulty with hearing/understanding during the telephone conversation . Reports that she has had a formal eye exam by an eye care professional within the past year . Reports that she has not had a formal hearing evaluation within the past year *Unable to fully assess hearing and vision during telephone visit type  Cognitive Function: 6CIT Screen 11/24/2020 10/23/2019 10/21/2018  What Year? 0 points 0 points 0 points  What month? 0 points 0 points 0 points  What time? 0 points 0 points 0 points  Count back from 20 0 points 0 points 0 points  Months in reverse 0 points 0 points 0 points  Repeat phrase 0 points 0 points 0 points  Total Score 0 0 0   (Normal:0-7, Significant for Dysfunction: >8)  Normal Cognitive Function Screening: Yes   Immunization & Health Maintenance Record Immunization History  Administered Date(s) Administered  . Influenza Whole 10/25/2012  . Influenza, High Dose Seasonal PF 10/06/2016, 10/15/2017, 10/21/2018, 11/08/2019  . Influenza,inj,Quad PF,6+ Mos 11/28/2013, 10/27/2014, 10/19/2015  . Moderna SARS-COVID-2 Vaccination 01/09/2020, 02/09/2020  . Pneumococcal Conjugate-13 07/14/2014, 04/11/2017  . Pneumococcal Polysaccharide-23 07/29/2015  . Td 01/27/2008  . Tdap 11/08/2019    Health Maintenance  Topic Date Due  . INFLUENZA VACCINE  07/18/2020  . DEXA SCAN  11/04/2021  . MAMMOGRAM  10/15/2022  . COLONOSCOPY  03/22/2027  . TETANUS/TDAP  11/07/2029  . COVID-19 Vaccine  Completed  . Hepatitis C Screening  Completed  . PNA vac Low Risk Adult  Completed       Assessment  This is a routine wellness examination for [employer].  Health Maintenance: Due or Overdue Health Maintenance Due  Topic  Date Due  . INFLUENZA VACCINE  07/18/2020    Flois G Keizer does not need a referral for [employer] Assistance: Care Management:   no Social Work:    no Prescription Assistance:  no Nutrition/Diabetes Education:  no   Plan:  Personalized Goals Goals Addressed            This Visit's Progress   . awv       11/24/2020 AWV Goal: Fall Prevention  . Over the next year, patient will decrease their risk for falls by: o Using assistive devices, such as a cane or walker, as needed o Identifying fall risks within their home and correcting them by: - Removing throw rugs - Adding handrails to stairs or ramps - Removing clutter and keeping a clear pathway throughout the home - Increasing light, especially at night - Adding shower handles/bars - Raising toilet seat o Identifying potential personal risk factors for falls: - Medication side effects - Incontinence/urgency - Vestibular dysfunction - Hearing loss - Musculoskeletal disorders - Neurological disorders - Orthostatic hypotension  .awv      Personalized Health Maintenance & Screening Recommendations  Influenza vaccine, covid booster and shingles vaccine  Lung Cancer Screening Recommended: not applicable (Low Dose CT Chest recommended if Age 30-80 years, 30 pack-year currently smoking OR have quit w/in past 15 years) Hepatitis C Screening recommended: not applicable HIV Screening recommended: no  Advanced Directives: Written information was not prepared per patient's request.  Referrals & Orders No orders of the defined types were placed in this encounter.   Follow-up Plan . Follow-up with Dettinger, Fransisca Kaufmann, MD as planned . Schedule flu shot, covid booster and shingles vaccine. . AVS printed and mailed to patient.   I have personally reviewed and noted the following in the patient's chart:   . Medical and social history . Use of alcohol, tobacco or illicit drugs  . Current medications and  supplements . Functional ability and status . Nutritional status . Physical activity . Advanced directives . List of other physicians . Hospitalizations, surgeries, and ER visits in previous 12 months . Vitals . Screenings to include cognitive, depression, and falls . Referrals and appointments  In addition, I have reviewed and discussed with Tishana G Bezold certain preventive protocols, quality metrics, and best practice recommendations. A written personalized care plan for preventive services as well as general preventive health recommendations is available and can be mailed to the patient at her request.      Lynnea Ferrier, LPN  29/04/6212

## 2020-11-24 NOTE — Patient Instructions (Signed)
  [place] Maintenance Summary and Written Plan of Care  Ms. Teresa Patel ,  Thank you for allowing me to perform your Medicare Annual Wellness Visit and for your ongoing commitment to your health.   Health Maintenance & Immunization History Health Maintenance  Topic Date Due  . INFLUENZA VACCINE  07/18/2020  . DEXA SCAN  11/04/2021  . MAMMOGRAM  10/15/2022  . COLONOSCOPY  03/22/2027  . TETANUS/TDAP  11/07/2029  . COVID-19 Vaccine  Completed  . Hepatitis C Screening  Completed  . PNA vac Low Risk Adult  Completed   Immunization History  Administered Date(s) Administered  . Influenza Whole 10/25/2012  . Influenza, High Dose Seasonal PF 10/06/2016, 10/15/2017, 10/21/2018, 11/08/2019  . Influenza,inj,Quad PF,6+ Mos 11/28/2013, 10/27/2014, 10/19/2015  . Moderna SARS-COVID-2 Vaccination 01/09/2020, 02/09/2020  . Pneumococcal Conjugate-13 07/14/2014, 04/11/2017  . Pneumococcal Polysaccharide-23 07/29/2015  . Td 01/27/2008  . Tdap 11/08/2019    These are the patient goals that we discussed: Goals Addressed            This Visit's Progress   . awv       11/24/2020 AWV Goal: Fall Prevention  . Over the next year, patient will decrease their risk for falls by: o Using assistive devices, such as a cane or walker, as needed o Identifying fall risks within their home and correcting them by: - Removing throw rugs - Adding handrails to stairs or ramps - Removing clutter and keeping a clear pathway throughout the home - Increasing light, especially at night - Adding shower handles/bars - Raising toilet seat o Identifying potential personal risk factors for falls: - Medication side effects - Incontinence/urgency - Vestibular dysfunction - Hearing loss - Musculoskeletal disorders - Neurological disorders - Orthostatic hypotension  .awv        This is a list of Health Maintenance Items that are overdue or due now: Health Maintenance Due  Topic Date  Due  . INFLUENZA VACCINE  07/18/2020     Orders/Referrals Placed Today: No orders of the defined types were placed in this encounter.  (Contact our referral department at [phone] if you have not spoken with someone about your referral appointment within the next 5 days)    Follow-up Plan . Follow-up with Dettinger, Fransisca Kaufmann, MD as planned . Schedule flu shot, covid booster and shingles vaccine. . AVS printed and mailed to patient.

## 2020-11-26 ENCOUNTER — Ambulatory Visit (INDEPENDENT_AMBULATORY_CARE_PROVIDER_SITE_OTHER): Payer: PPO

## 2020-11-26 ENCOUNTER — Other Ambulatory Visit: Payer: Self-pay

## 2020-11-26 DIAGNOSIS — Z23 Encounter for immunization: Secondary | ICD-10-CM | POA: Diagnosis not present

## 2021-06-24 ENCOUNTER — Ambulatory Visit (INDEPENDENT_AMBULATORY_CARE_PROVIDER_SITE_OTHER): Payer: PPO | Admitting: Family Medicine

## 2021-06-24 DIAGNOSIS — U071 COVID-19: Secondary | ICD-10-CM | POA: Diagnosis not present

## 2021-06-24 MED ORDER — MOLNUPIRAVIR EUA 200MG CAPSULE: 4.0000 | ORAL_CAPSULE | Freq: Two times a day (BID) | ORAL | 0 refills | Status: AC

## 2021-06-24 NOTE — Progress Notes (Signed)
Virtual Visit via telephone Note  I connected with Teresa Patel on 06/24/21 at 1557 by telephone and verified that I am speaking with the correct person using two identifiers. Teresa Patel is currently located at home and patient are currently with her during visit. The provider, Fransisca Kaufmann Josearmando Kuhnert, MD is located in their office at time of visit.  Call ended at 1605  I discussed the limitations, risks, security and privacy concerns of performing an evaluation and management service by telephone and the availability of in person appointments. I also discussed with the patient that there may be a patient responsible charge related to this service. The patient expressed understanding and agreed to proceed.   History and Present Illness: Patient is calling in for cough and congestion and fever and body aches that started last night.  She has a sinus headache.  Her husband has been dealing with Covid.  She has runny nose.  She did a home test for covid.   She has temp of 99.8.  her husband is getting some better.   1. COVID-19 virus infection     Outpatient Encounter Medications as of 06/24/2021  Medication Sig   molnupiravir EUA 200 mg CAPS Take 4 capsules (800 mg total) by mouth 2 (two) times daily for 5 days.   atorvastatin (LIPITOR) 40 MG tablet Take 1 tablet (40 mg total) by mouth daily at 6 PM.   calcium-vitamin D (OSCAL WITH D) 500-200 MG-UNIT TABS tablet Take by mouth.   co-enzyme Q-10 50 MG capsule Take 50 mg by mouth daily.   ibuprofen (ADVIL,MOTRIN) 200 MG tablet Take 200 mg by mouth as needed for moderate pain.    lisinopril (ZESTRIL) 20 MG tablet Take 1 tablet (20 mg total) by mouth daily.   loratadine (CLARITIN) 10 MG tablet Take 10 mg by mouth daily as needed for allergies.   Omega-3 Fatty Acids (FISH OIL PO) Take 540 mg by mouth daily.   No facility-administered encounter medications on file as of 06/24/2021.    Review of Systems  Constitutional:  Negative for chills and  fever.  HENT:  Positive for congestion. Negative for ear discharge and ear pain.   Eyes:  Negative for visual disturbance.  Respiratory:  Positive for cough. Negative for chest tightness, shortness of breath and wheezing.   Cardiovascular:  Negative for chest pain and leg swelling.  Musculoskeletal:  Negative for back pain and gait problem.  Skin:  Negative for rash.  Neurological:  Negative for light-headedness and headaches.  Psychiatric/Behavioral:  Negative for agitation and behavioral problems.   All other systems reviewed and are negative.  Observations/Objective: Patient sounds comfortable and in no acute distress  Assessment and Plan: Problem List Items Addressed This Visit   None Visit Diagnoses     COVID-19 virus infection    -  Primary   Relevant Medications   molnupiravir EUA 200 mg CAPS       Sent antiviral, did not send the other 1 because there was an interaction with one of her medicines. Follow up plan: Return if symptoms worsen or fail to improve.     I discussed the assessment and treatment plan with the patient. The patient was provided an opportunity to ask questions and all were answered. The patient agreed with the plan and demonstrated an understanding of the instructions.   The patient was advised to call back or seek an in-person evaluation if the symptoms worsen or if the condition fails to improve  as anticipated.  The above assessment and management plan was discussed with the patient. The patient verbalized understanding of and has agreed to the management plan. Patient is aware to call the clinic if symptoms persist or worsen. Patient is aware when to return to the clinic for a follow-up visit. Patient educated on when it is appropriate to go to the emergency department.    I provided 8 minutes of non-face-to-face time during this encounter.    Worthy Rancher, MD

## 2021-08-30 ENCOUNTER — Other Ambulatory Visit: Payer: Self-pay

## 2021-08-30 ENCOUNTER — Ambulatory Visit (INDEPENDENT_AMBULATORY_CARE_PROVIDER_SITE_OTHER): Payer: PPO | Admitting: Family Medicine

## 2021-08-30 ENCOUNTER — Encounter: Payer: Self-pay | Admitting: Family Medicine

## 2021-08-30 VITALS — BP 128/79 | HR 69 | Ht 65.0 in | Wt 163.0 lb

## 2021-08-30 DIAGNOSIS — K219 Gastro-esophageal reflux disease without esophagitis: Secondary | ICD-10-CM | POA: Diagnosis not present

## 2021-08-30 DIAGNOSIS — I10 Essential (primary) hypertension: Secondary | ICD-10-CM | POA: Diagnosis not present

## 2021-08-30 DIAGNOSIS — E782 Mixed hyperlipidemia: Secondary | ICD-10-CM | POA: Diagnosis not present

## 2021-08-30 DIAGNOSIS — S76312A Strain of muscle, fascia and tendon of the posterior muscle group at thigh level, left thigh, initial encounter: Secondary | ICD-10-CM

## 2021-08-30 MED ORDER — ATORVASTATIN CALCIUM 40 MG PO TABS: 40.0000 mg | ORAL_TABLET | Freq: Every day | ORAL | 3 refills | Status: DC

## 2021-08-30 MED ORDER — METHYLPREDNISOLONE ACETATE 40 MG/ML IJ SUSP
80.0000 mg | Freq: Once | INTRAMUSCULAR | Status: AC
  Administered 2021-08-30: 80 mg via INTRAMUSCULAR

## 2021-08-30 MED ORDER — LISINOPRIL 20 MG PO TABS: 20.0000 mg | ORAL_TABLET | Freq: Every day | ORAL | 3 refills | Status: DC

## 2021-08-30 NOTE — Progress Notes (Signed)
BP 128/79   Pulse 69   Ht '5\' 5"'  (1.651 m)   Wt 163 lb (73.9 kg)   SpO2 97%   BMI 27.12 kg/m    Subjective:   Patient ID: Teresa Patel, female    DOB: 02-Aug-1951, 70 y.o.   MRN: [id number]  HPI: Teresa Patel is a 69 y.o. female presenting on 08/30/2021 for Medical Management of Chronic Issues, Hyperlipidemia, Hypertension, and Back Pain (Lower. Radiates to left buttock.)   HPI Hypertension Patient is currently on lisinopril, and their blood pressure today is 128/79. Patient denies any lightheadedness or dizziness. Patient denies headaches, blurred vision, chest pains, shortness of breath, or weakness. Denies any side effects from medication and is content with current medication.   Hyperlipidemia Patient is coming in for recheck of his hyperlipidemia. The patient is currently taking atorvastatin. They deny any issues with myalgias or history of liver damage from it. They deny any focal numbness or weakness or chest pain.   GERD Patient is currently on no medication but having some issues with reflux, will start over-the-counter omeprazole.  She denies any major symptoms or abdominal pain or belching or burping. She denies any blood in her stool or lightheadedness or dizziness.   Patient is coming in complaining of left lower buttock/hip pain that has been going on over the past couple months.  She says it hurts more when she pushes or pulls on things or bends over it and then it all started hurting just recently over the past couple weeks when she sitting on it especially on certain chairs or in the car.  Relevant past medical, surgical, family and social history reviewed and updated as indicated. Interim medical history since our last visit reviewed. Allergies and medications reviewed and updated.  Review of Systems  Constitutional:  Negative for chills and fever.  Eyes:  Negative for visual disturbance.  Respiratory:  Negative for chest tightness and shortness of breath.    Cardiovascular:  Negative for chest pain and leg swelling.  Musculoskeletal:  Positive for arthralgias and back pain. Negative for gait problem.  Skin:  Negative for rash.  Neurological:  Negative for light-headedness and headaches.  Psychiatric/Behavioral:  Negative for agitation and behavioral problems.   All other systems reviewed and are negative.  Per HPI unless specifically indicated above   Allergies as of 08/30/2021   No Known Allergies      Medication List        Accurate as of August 30, 2021  8:57 AM. If you have any questions, ask your nurse or doctor.          STOP taking these medications    calcium-vitamin D 500-200 MG-UNIT Tabs tablet Commonly known as: OSCAL WITH D Stopped by: Fransisca Kaufmann Dannah Ryles, MD       TAKE these medications    atorvastatin 40 MG tablet Commonly known as: LIPITOR Take 1 tablet (40 mg total) by mouth daily at 6 PM.   co-enzyme Q-10 50 MG capsule Take 50 mg by mouth daily.   FISH OIL PO Take 540 mg by mouth daily.   ibuprofen 200 MG tablet Commonly known as: ADVIL Take 200 mg by mouth as needed for moderate pain.   lisinopril 20 MG tablet Commonly known as: ZESTRIL Take 1 tablet (20 mg total) by mouth daily.   loratadine 10 MG tablet Commonly known as: CLARITIN Take 10 mg by mouth daily as needed for allergies.   ONE-A-DAY 50 PLUS PO Take by  mouth.         Objective:   BP 128/79   Pulse 69   Ht '5\' 5"'  (1.651 m)   Wt 163 lb (73.9 kg)   SpO2 97%   BMI 27.12 kg/m   Wt Readings from Last 3 Encounters:  08/30/21 163 lb (73.9 kg)  08/26/20 158 lb 8 oz (71.9 kg)  09/05/19 164 lb 6.4 oz (74.6 kg)    Physical Exam Vitals and nursing note reviewed.  Constitutional:      General: She is not in acute distress.    Appearance: She is well-developed. She is not diaphoretic.  Eyes:     Conjunctiva/sclera: Conjunctivae normal.  Cardiovascular:     Rate and Rhythm: Normal rate and regular rhythm.     Heart  sounds: Normal heart sounds. No murmur heard. Pulmonary:     Effort: Pulmonary effort is normal. No respiratory distress.     Breath sounds: Normal breath sounds. No wheezing.  Musculoskeletal:        General: No swelling or tenderness. Normal range of motion.       Legs:  Skin:    General: Skin is warm and dry.     Findings: No rash.  Neurological:     Mental Status: She is alert and oriented to person, place, and time.     Coordination: Coordination normal.  Psychiatric:        Behavior: Behavior normal.      Assessment & Plan:   Problem List Items Addressed This Visit       Cardiovascular and Mediastinum   Hypertension - Primary   Relevant Medications   atorvastatin (LIPITOR) 40 MG tablet   lisinopril (ZESTRIL) 20 MG tablet   Other Relevant Orders   CMP14+EGFR     Digestive   GERD (gastroesophageal reflux disease)   Relevant Orders   CBC with Differential/Platelet     Other   Hyperlipidemia   Relevant Medications   atorvastatin (LIPITOR) 40 MG tablet   lisinopril (ZESTRIL) 20 MG tablet   Other Relevant Orders   Lipid panel   Other Visit Diagnoses     Essential hypertension       Relevant Medications   atorvastatin (LIPITOR) 40 MG tablet   lisinopril (ZESTRIL) 20 MG tablet   Other Relevant Orders   CMP14+EGFR   Strain of left piriformis muscle, initial encounter       Relevant Medications   methylPREDNISolone acetate (DEPO-MEDROL) injection 80 mg (Start on 08/30/2021  9:00 AM)       Likely piriformis syndrome based on pain and location and range of motion.  Gave steroid injection to help with anti-inflammatories and gave stretching regiment.  Return if does not improve. Will check blood work, blood pressure looks good no other change medication for today. Follow up plan: Return in about 1 year (around 08/30/2022), or if symptoms worsen or fail to improve, for Yearly checkup for hypertension and cholesterol.  Counseling provided for all of the vaccine  components Orders Placed This Encounter  Procedures   CBC with Differential/Platelet   CMP14+EGFR   Lipid panel    Caryl Pina, MD [facility] 08/30/2021, 8:57 AM

## 2021-08-31 LAB — CBC WITH DIFFERENTIAL/PLATELET
Basophils Absolute: 0.1 10*3/uL (ref 0.0–0.2)
Basos: 1 %
EOS (ABSOLUTE): 0.2 10*3/uL (ref 0.0–0.4)
Eos: 3 %
Hematocrit: 43.7 % (ref 34.0–46.6)
Hemoglobin: 14.1 g/dL (ref 11.1–15.9)
Immature Grans (Abs): 0 10*3/uL (ref 0.0–0.1)
Immature Granulocytes: 0 %
Lymphocytes Absolute: 2.4 10*3/uL (ref 0.7–3.1)
Lymphs: 39 %
MCH: 29 pg (ref 26.6–33.0)
MCHC: 32.3 g/dL (ref 31.5–35.7)
MCV: 90 fL (ref 79–97)
Monocytes Absolute: 0.6 10*3/uL (ref 0.1–0.9)
Monocytes: 10 %
Neutrophils Absolute: 3 10*3/uL (ref 1.4–7.0)
Neutrophils: 47 %
Platelets: 264 10*3/uL (ref 150–450)
RBC: 4.86 x10E6/uL (ref 3.77–5.28)
RDW: 12.1 % (ref 11.7–15.4)
WBC: 6.2 10*3/uL (ref 3.4–10.8)

## 2021-08-31 LAB — CMP14+EGFR
ALT: 24 IU/L (ref 0–32)
AST: 26 IU/L (ref 0–40)
Albumin/Globulin Ratio: 1.6 (ref 1.2–2.2)
Albumin: 4.4 g/dL (ref 3.7–4.7)
Alkaline Phosphatase: 73 IU/L (ref 44–121)
BUN/Creatinine Ratio: 16 (ref 12–28)
BUN: 14 mg/dL (ref 8–27)
Bilirubin Total: 0.4 mg/dL (ref 0.0–1.2)
CO2: 23 mmol/L (ref 20–29)
Calcium: 10.1 mg/dL (ref 8.7–10.3)
Chloride: 105 mmol/L (ref 96–106)
Creatinine, Ser: 0.87 mg/dL (ref 0.57–1.00)
Globulin, Total: 2.7 g/dL (ref 1.5–4.5)
Glucose: 110 mg/dL — ABNORMAL HIGH (ref 65–99)
Potassium: 4.7 mmol/L (ref 3.5–5.2)
Sodium: 141 mmol/L (ref 134–144)
Total Protein: 7.1 g/dL (ref 6.0–8.5)
eGFR: 71 mL/min/{1.73_m2} (ref >59)

## 2021-08-31 LAB — LIPID PANEL
Chol/HDL Ratio: 4.7 ratio — ABNORMAL HIGH (ref 0.0–4.4)
Cholesterol, Total: 198 mg/dL (ref 100–199)
HDL: 42 mg/dL (ref >39)
LDL Chol Calc (NIH): 109 mg/dL — ABNORMAL HIGH (ref 0–99)
Triglycerides: 270 mg/dL — ABNORMAL HIGH (ref 0–149)
VLDL Cholesterol Cal: 47 mg/dL — ABNORMAL HIGH (ref 5–40)

## 2021-09-01 ENCOUNTER — Other Ambulatory Visit: Payer: Self-pay | Admitting: Family Medicine

## 2021-09-01 DIAGNOSIS — Z1231 Encounter for screening mammogram for malignant neoplasm of breast: Secondary | ICD-10-CM

## 2021-10-19 IMAGING — MG DIGITAL SCREENING BILAT W/ TOMO W/ CAD
8 series · 8 of 24 positions shown · non-contrast
Comparison: Previous exam(s).

CLINICAL DATA: Screening.

EXAM:
DIGITAL SCREENING BILATERAL MAMMOGRAM WITH TOMO AND CAD

[R MLO synth-2D]
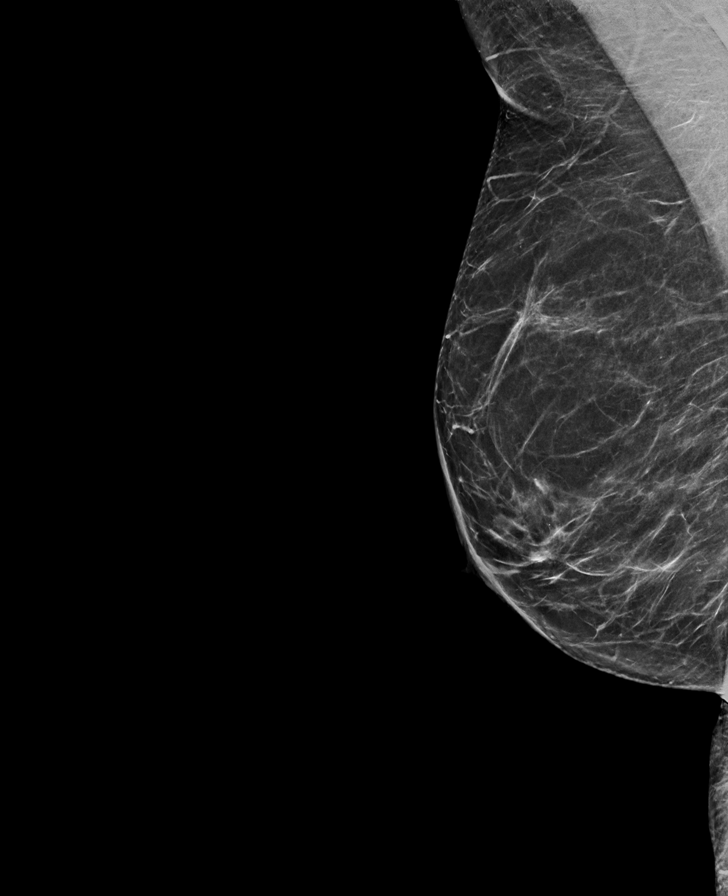

[L MLO synth-2D]
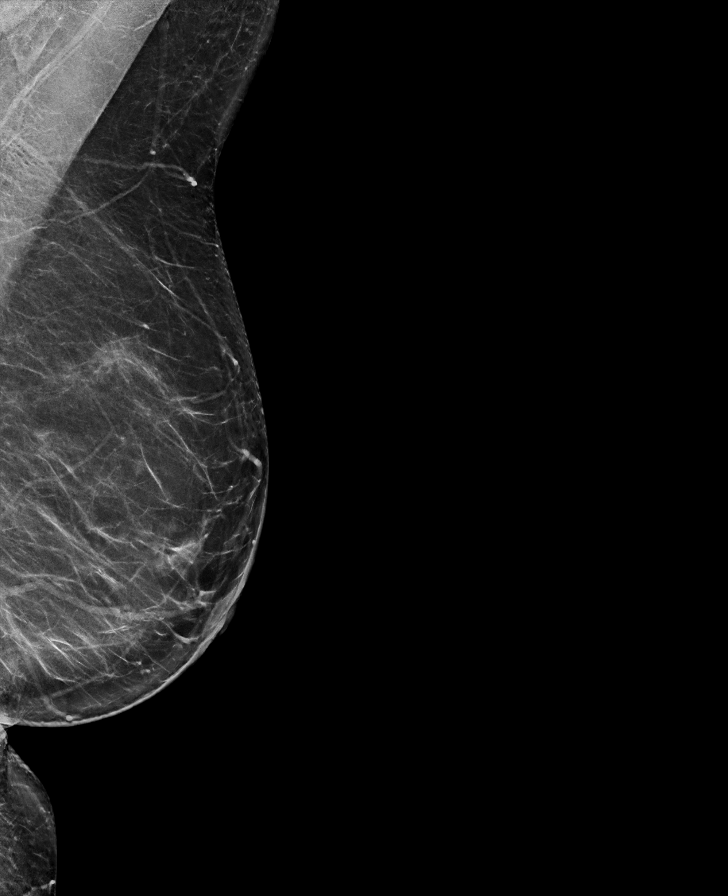

[L CC synth-2D]
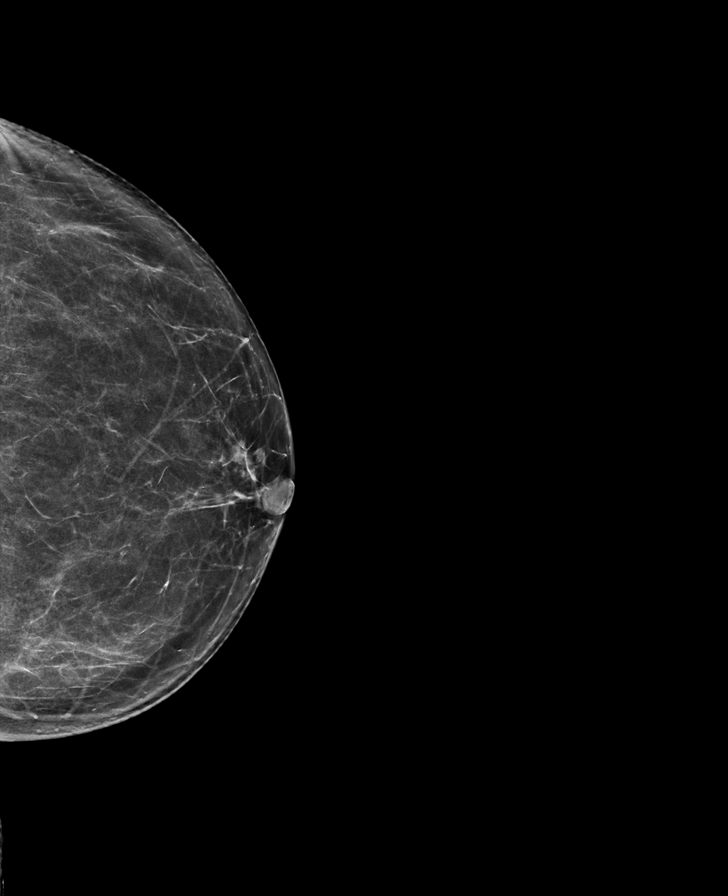

[R CC synth-2D]
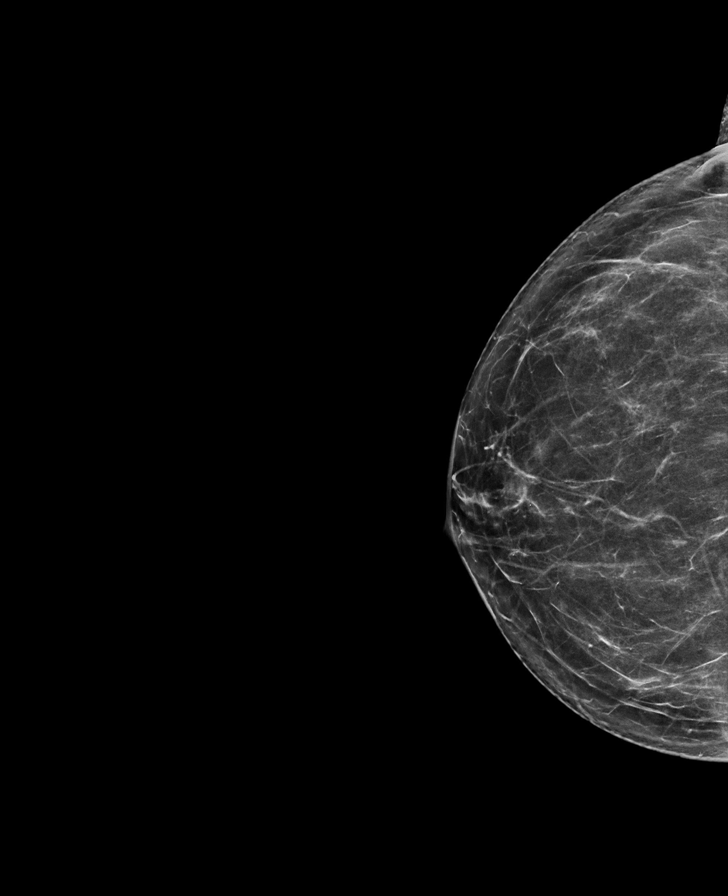

[R CC tomo · tomo slice 34/67.0]
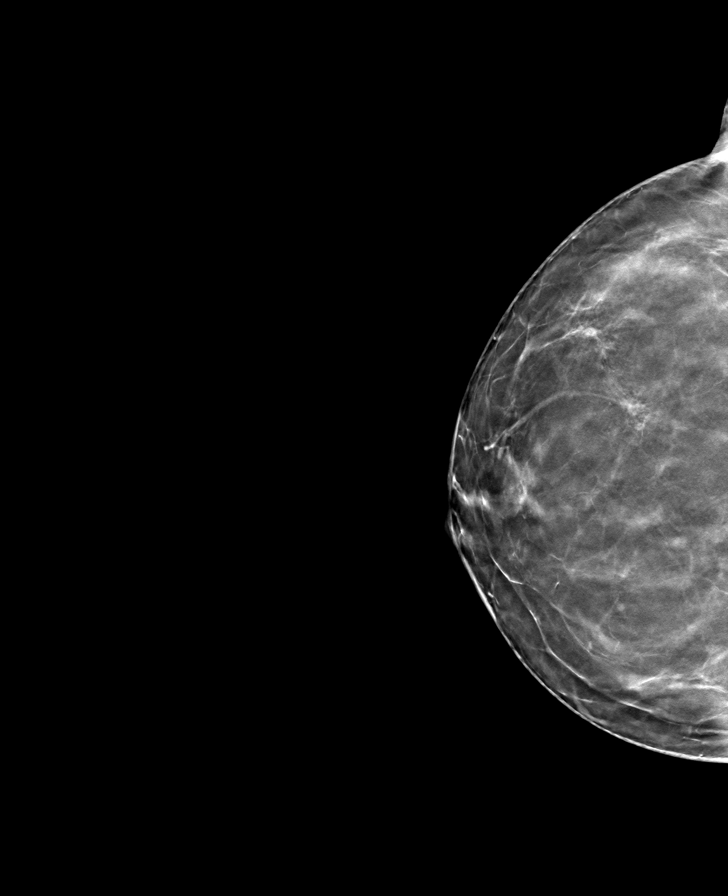

[L MLO tomo · tomo slice 37/73.0]
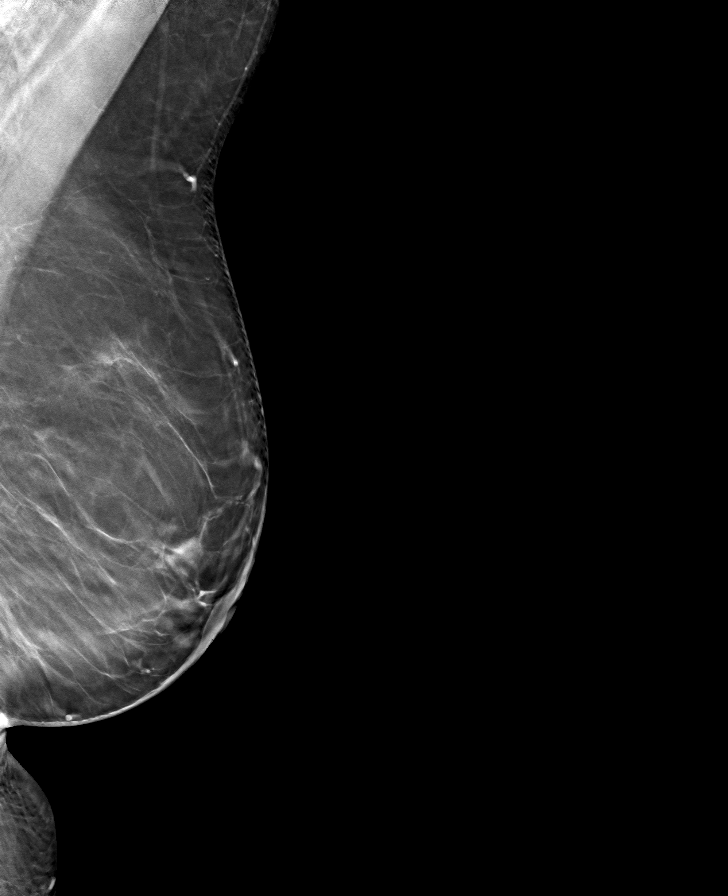

[L CC tomo · tomo slice 33/66.0]
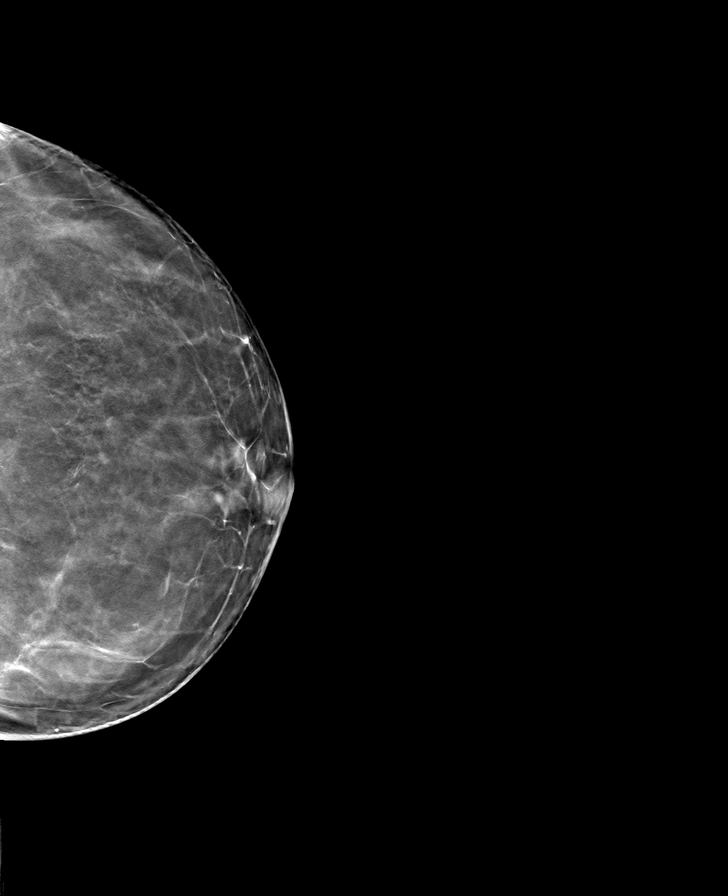

[R MLO tomo · tomo slice 35/68.0]
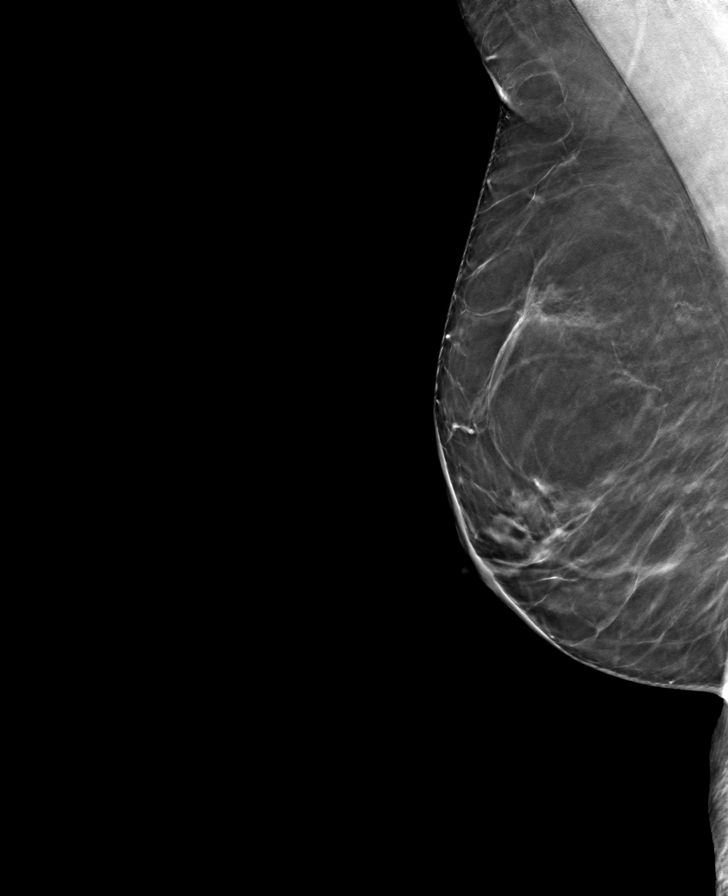

[8 of 24 positions shown; findings below may reference images not displayed]

ACR Breast Density Category b: There are scattered areas of
fibroglandular density.
FINDINGS: There are no findings suspicious for malignancy. Images were
processed with CAD.
IMPRESSION: No mammographic evidence of malignancy. A result letter of this
screening mammogram will be mailed directly to the patient.

RECOMMENDATION:
Screening mammogram in one year. (Code:[id number])

BI-RADS CATEGORY  1: Negative.

## 2021-10-24 ENCOUNTER — Other Ambulatory Visit: Payer: Self-pay

## 2021-10-24 ENCOUNTER — Ambulatory Visit
Admission: RE | Admit: 2021-10-24 | Discharge: 2021-10-24 | Disposition: A | Discharge status: Home / Self Care | Payer: PPO | Source: Ambulatory Visit | Attending: Family Medicine | Admitting: Family Medicine

## 2021-10-24 DIAGNOSIS — Z1231 Encounter for screening mammogram for malignant neoplasm of breast: Secondary | ICD-10-CM | POA: Diagnosis not present

## 2021-11-25 ENCOUNTER — Ambulatory Visit (INDEPENDENT_AMBULATORY_CARE_PROVIDER_SITE_OTHER): Payer: PPO | Admitting: *Deleted

## 2021-11-25 DIAGNOSIS — Z0000 Encounter for general adult medical examination without abnormal findings: Secondary | ICD-10-CM | POA: Diagnosis not present

## 2021-11-25 NOTE — Progress Notes (Signed)
MEDICARE ANNUAL WELLNESS VISIT  11/25/2021  Telephone Visit Disclaimer This Medicare AWV was conducted by telephone due to national recommendations for restrictions regarding the COVID-19 Pandemic (e.g. social distancing).  I verified, using two identifiers, that I am speaking with Teresa Patel or their authorized healthcare agent. I discussed the limitations, risks, security, and privacy concerns of performing an evaluation and management service by telephone and the potential availability of an in-person appointment in the future. The patient expressed understanding and agreed to proceed.  Location of Patient: home Location of Provider (nurse):  office  Subjective:    Teresa Patel is a 70 y.o. female patient of Dettinger, Fransisca Kaufmann, MD who had a Medicare Annual Wellness Visit today via telephone. Teresa Patel is Retired and lives with their spouse. she has 3 children. she reports that she is socially active and does interact with friends/family regularly. she is minimally physically active and enjoys gardening.  Patient Care Team: Dettinger, Fransisca Kaufmann, MD as PCP - General (Family Medicine) Rogene Houston, MD as Consulting Physician (Gastroenterology) Gari Crown, MD as Referring Physician (Obstetrics and Gynecology) Leticia Clas, [place] (Optometry)  Advanced Directives 11/25/2021 11/24/2020 10/23/2019 10/15/2017 03/21/2017 03/13/2016  Does Patient Have a Medical Advance Directive? Yes Yes Yes Yes Yes Yes  Type of Paramedic of [place];Out of facility DNR (pink MOST or yellow form);Living will [place];Living will [place];Living will [place];Living will [place];Living will [place];Living will  Does patient want to make changes to medical advance directive? No - Patient declined - No - Patient declined No - Patient declined - No - Patient declined  Copy of [place] in Chart? No - copy requested No - copy requested No - copy requested No - copy requested No - copy requested No - copy requested    Hospital Utilization Over the Past 12 Months: # of hospitalizations or ER visits: 0 # of surgeries: 0  Review of Systems    Patient reports that her overall health is unchanged compared to last year.  History obtained from chart review and the patient  Patient Reported Readings (BP, Pulse, CBG, Weight, etc) none  Pain Assessment Pain : No/denies pain     Current Medications & Allergies (verified) Allergies as of 11/25/2021   No Known Allergies      Medication List        Accurate as of November 25, 2021  9:14 AM. If you have any questions, ask your nurse or doctor.          atorvastatin 40 MG tablet Commonly known as: LIPITOR Take 1 tablet (40 mg total) by mouth daily at 6 PM.   co-enzyme Q-10 50 MG capsule Take 50 mg by mouth daily.   FISH OIL PO Take 540 mg by mouth daily.   ibuprofen 200 MG tablet Commonly known as: ADVIL Take 200 mg by mouth as needed for moderate pain.   lisinopril 20 MG tablet Commonly known as: ZESTRIL Take 1 tablet (20 mg total) by mouth daily.   loratadine 10 MG tablet Commonly known as: CLARITIN Take 10 mg by mouth daily as needed for allergies.   ONE-A-DAY 50 PLUS PO Take by mouth.        History (reviewed): Past Medical History:  Diagnosis Date   Anxiety    GERD (gastroesophageal reflux disease)    Hyperlipidemia    Hypertension  Shingles    Past Surgical History:  Procedure Laterality Date   ABDOMINAL HYSTERECTOMY     carpal tunnel Bilateral    COLONOSCOPY N/A 03/21/2017   Procedure: COLONOSCOPY;  Surgeon: Rogene Houston, MD;  [facility];  Service: Endoscopy;  Laterality: N/A;  1030   POLYPECTOMY  03/21/2017   Procedure: POLYPECTOMY;  Surgeon: Rogene Houston, MD;  Location: [facility];  Service: Endoscopy;;  colon   Family History  Problem  Relation Age of Onset   Heart disease Mother    Heart disease Father    Cancer Father        prostate   Stroke Sister    Melanoma Daughter 2   Cancer Maternal Grandmother        unknown   Stroke Paternal Grandfather    Cancer Brother        pancreatic   Heart disease Brother        heart attack   Heart disease Brother    Diabetes Brother    Kidney disease Brother    Cancer Brother        prostate   Colon cancer Neg Hx    Breast cancer Neg Hx    Social History   Socioeconomic History   Marital status: Married    Spouse name: Mikeal Hawthorne   Number of children: 3   Years of education: 12   Highest education level: High school graduate  Occupational History   Occupation: Retired    Comment: Art gallery manager  Tobacco Use   Smoking status: Passive Smoke Exposure - Never Smoker   Smokeless tobacco: Never  Scientific laboratory technician Use: Never used  Substance and Sexual Activity   Alcohol use: No   Drug use: No   Sexual activity: Yes  Other Topics Concern   Not on file  Social History Narrative   Not on file   Social Determinants of Health   Financial Resource Strain: Low Risk    Difficulty of Paying Living Expenses: Not hard at all  Food Insecurity: No Food Insecurity   Worried About Charity fundraiser in the Last Year: Never true   [place] in the Last Year: Never true  Transportation Needs: No Transportation Needs   Lack of Transportation (Medical): No   Lack of Transportation (Non-Medical): No  Physical Activity: Inactive   Days of Exercise per Week: 0 days   Minutes of Exercise per Session: 0 min  Stress: No Stress Concern Present   Feeling of Stress : Only a little  Social Connections: Moderately Integrated   Frequency of Communication with Friends and Family: More than three times a week   Frequency of Social Gatherings with Friends and Family: More than three times a week   Attends Religious Services: More than 4 times per year   Active Member of  [employer] or Organizations: No   Attends Archivist Meetings: Never   Marital Status: Married    Activities of Daily Living In your present state of health, do you have any difficulty performing the following activities: 11/25/2021  Hearing? N  Vision? N  Difficulty concentrating or making decisions? N  Walking or climbing stairs? N  Dressing or bathing? N  Doing errands, shopping? N  Preparing Food and eating ? N  Using the Toilet? N  In the past six months, have you accidently leaked urine? N  Do you have problems with loss of bowel control? N  Managing your  Medications? N  Managing your Finances? N  Housekeeping or managing your Housekeeping? N  Some recent data might be hidden    Patient Education/ Literacy How often do you need to have someone help you when you read instructions, pamphlets, or other written materials from your doctor or pharmacy?: 1 - Never What is the last grade level you completed in school?: 12  Exercise Current Exercise Habits: The patient does not participate in regular exercise at present, Exercise limited by: None identified  Diet Patient reports consuming 3 meals a day and 0 snack(s) a day Patient reports that her primary diet is: Low Sodium Patient reports that she does have regular access to food.   Depression Screen PHQ 2/9 Scores 11/25/2021 08/30/2021 11/24/2020 08/26/2020 10/23/2019 09/05/2019 02/21/2019  PHQ - 2 Score 0 0 0 0 0 0 0  PHQ- 9 Score - - - - - - 0     Fall Risk Fall Risk  11/25/2021 08/30/2021 11/24/2020 08/26/2020 10/23/2019  Falls in the past year? 1 0 1 1 0  Number falls in past yr: 0 - 0 0 -  Injury with Fall? 0 - 0 0 -  Risk for fall due to : History of fall(s) - No Fall Risks Impaired balance/gait -  Follow up Falls prevention discussed - - Falls evaluation completed -     Objective:  Teresa Patel seemed alert and oriented and she participated appropriately during our telephone visit.  Blood Pressure Weight BMI  BP  Readings from Last 3 Encounters:  08/30/21 128/79  08/26/20 123/78  09/05/19 130/89   Wt Readings from Last 3 Encounters:  08/30/21 163 lb (73.9 kg)  08/26/20 158 lb 8 oz (71.9 kg)  09/05/19 164 lb 6.4 oz (74.6 kg)   BMI Readings from Last 1 Encounters:  08/30/21 27.12 kg/m    *Unable to obtain current vital signs, weight, and BMI due to telephone visit type  Hearing/Vision  Teresa Patel did not seem to have difficulty with hearing/understanding during the telephone conversation Reports that she has had a formal eye exam by an eye care professional within the past year Reports that she has not had a formal hearing evaluation within the past year *Unable to fully assess hearing and vision during telephone visit type  Cognitive Function: 6CIT Screen 11/25/2021 11/25/2021 11/24/2020 10/23/2019 10/21/2018  What Year? 0 points 0 points 0 points 0 points 0 points  What month? 0 points 0 points 0 points 0 points 0 points  What time? 0 points 0 points 0 points 0 points 0 points  Count back from 20 0 points 0 points 0 points 0 points 0 points  Months in reverse 0 points - 0 points 0 points 0 points  Repeat phrase 0 points - 0 points 0 points 0 points  Total Score 0 - 0 0 0   (Normal:0-7, Significant for Dysfunction: >8)  Normal Cognitive Function Screening: Yes   Immunization & Health Maintenance Record Immunization History  Administered Date(s) Administered   Fluad Quad(high Dose 65+) 11/26/2020, 10/25/2021   Influenza Whole 10/25/2012   Influenza, High Dose Seasonal PF 10/06/2016, 10/15/2017, 10/21/2018, 11/08/2019   Influenza,inj,Quad PF,6+ Mos 11/28/2013, 10/27/2014, 10/19/2015   Moderna SARS-COV2 Booster Vaccination 12/07/2020   Moderna Sars-Covid-2 Vaccination 01/09/2020, 02/09/2020   Pneumococcal Conjugate-13 07/14/2014, 04/11/2017   Pneumococcal Polysaccharide-23 07/29/2015   Td 01/27/2008   Tdap 11/08/2019    Health Maintenance  Topic Date Due   COVID-19 Vaccine (3 - Booster  for Moderna series) 02/01/2021  DEXA SCAN  11/04/2021   Zoster Vaccines- Shingrix (1 of 2) 11/29/2021 (Originally 04/14/1999)   MAMMOGRAM  10/25/2023   COLONOSCOPY (Pts 45-28yrs Insurance coverage will need to be confirmed)  03/22/2027   TETANUS/TDAP  11/07/2029   Pneumonia Vaccine 2+ Years old  Completed   INFLUENZA VACCINE  Completed   Hepatitis C Screening  Completed   HPV VACCINES  Aged Out       Assessment  This is a routine wellness examination for [employer].  Health Maintenance: Due or Overdue Health Maintenance Due  Topic Date Due   COVID-19 Vaccine (3 - Booster for Moderna series) 02/01/2021   DEXA SCAN  11/04/2021    Teresa Patel does not need a referral for [employer] Assistance: Care Management:   no Social Work:    no Prescription Assistance:  no Nutrition/Diabetes Education:  no   Plan:  Personalized Goals  Goals Addressed             This Visit's Progress    DIET - EAT MORE FRUITS AND VEGETABLES   On track    DIET - INCREASE WATER INTAKE   Not on track    DIET - INCREASE WATER INTAKE       Exercise 150 min/wk Moderate Activity   Not on track      Personalized Health Maintenance & Screening Recommendations  Bone densitometry screening Shingrix  Lung Cancer Screening Recommended: no (Low Dose CT Chest recommended if Age 59-80 years, 30 pack-year currently smoking OR have quit w/in past 15 years) Hepatitis C Screening recommended: no HIV Screening recommended: no  Advanced Directives: Written information was not prepared per patient's request.  Referrals & Orders No orders of the defined types were placed in this encounter.   Follow-up Plan Follow-up with Dettinger, Fransisca Kaufmann, MD as planned on 08/30/22. Pt is doing very well and is independent with all ADL's. Pt vision and hearing is good, has yearly eye exams. Pt is due for a Dexa Scan and shingrix vaccine. Pt has a living will, POA, and DNR, copies requested for our records. Pt  voices no healthcare concerns. AVS printed and mailed.   I have personally reviewed and noted the following in the patient's chart:   Medical and social history Use of alcohol, tobacco or illicit drugs  Current medications and supplements Functional ability and status Nutritional status Physical activity Advanced directives List of other physicians Hospitalizations, surgeries, and ER visits in previous 12 months Vitals Screenings to include cognitive, depression, and falls Referrals and appointments  In addition, I have reviewed and discussed with Teresa Patel certain preventive protocols, quality metrics, and best practice recommendations. A written personalized care plan for preventive services as well as general preventive health recommendations is available and can be mailed to the patient at her request.      Rana Snare, LPN  [id number]

## 2022-08-30 ENCOUNTER — Ambulatory Visit (INDEPENDENT_AMBULATORY_CARE_PROVIDER_SITE_OTHER): Payer: PPO

## 2022-08-30 ENCOUNTER — Encounter: Payer: Self-pay | Admitting: Family Medicine

## 2022-08-30 ENCOUNTER — Ambulatory Visit (INDEPENDENT_AMBULATORY_CARE_PROVIDER_SITE_OTHER): Payer: PPO | Admitting: Family Medicine

## 2022-08-30 VITALS — BP 128/69 | HR 70 | Temp 98.0°F | Ht 65.0 in | Wt 162.0 lb

## 2022-08-30 DIAGNOSIS — I10 Essential (primary) hypertension: Secondary | ICD-10-CM

## 2022-08-30 DIAGNOSIS — Z780 Asymptomatic menopausal state: Secondary | ICD-10-CM

## 2022-08-30 DIAGNOSIS — K219 Gastro-esophageal reflux disease without esophagitis: Secondary | ICD-10-CM | POA: Diagnosis not present

## 2022-08-30 DIAGNOSIS — E782 Mixed hyperlipidemia: Secondary | ICD-10-CM

## 2022-08-30 MED ORDER — LISINOPRIL 20 MG PO TABS: 20.0000 mg | ORAL_TABLET | Freq: Every day | ORAL | 3 refills | Status: DC

## 2022-08-30 MED ORDER — ATORVASTATIN CALCIUM 40 MG PO TABS: 40.0000 mg | ORAL_TABLET | Freq: Every day | ORAL | 3 refills | Status: DC

## 2022-08-30 NOTE — Progress Notes (Signed)
 BP 128/69   Pulse 70   Temp 98 F (36.7 C)   Ht 5' 5" (1.651 m)   Wt 162 lb (73.5 kg)   SpO2 95%   BMI 26.96 kg/m    Subjective:   Patient ID: Teresa Patel, female    DOB: 05/11/1951, 71 y.o.   MRN: [id number]  HPI: Teresa Patel is a 71 y.o. female presenting on 08/30/2022 for Medical Management of Chronic Issues, Hyperlipidemia, and Hypertension   HPI Hypertension Patient is currently on lisinopril, and their blood pressure today is 128/69. Patient denies any lightheadedness or dizziness. Patient denies headaches, blurred vision, chest pains, shortness of breath, or weakness. Denies any side effects from medication and is content with current medication.   Hyperlipidemia Patient is coming in for recheck of his hyperlipidemia. The patient is currently taking atorvastatin and fish oils. They deny any issues with myalgias or history of liver damage from it. They deny any focal numbness or weakness or chest pain.   GERD Patient is currently on no medication currently symptoms currently.  She denies any major symptoms or abdominal pain or belching or burping. She denies any blood in her stool or lightheadedness or dizziness.   Relevant past medical, surgical, family and social history reviewed and updated as indicated. Interim medical history since our last visit reviewed. Allergies and medications reviewed and updated.  Review of Systems  Constitutional:  Negative for chills and fever.  HENT:  Negative for congestion, ear discharge and ear pain.   Eyes:  Negative for redness and visual disturbance.  Respiratory:  Negative for chest tightness and shortness of breath.   Cardiovascular:  Negative for chest pain and leg swelling.  Genitourinary:  Negative for difficulty urinating and dysuria.  Musculoskeletal:  Negative for back pain and gait problem.  Skin:  Negative for rash.  Neurological:  Negative for dizziness, light-headedness and headaches.  Psychiatric/Behavioral:   Negative for agitation and behavioral problems.   All other systems reviewed and are negative.   Per HPI unless specifically indicated above   Allergies as of 08/30/2022   No Known Allergies      Medication List        Accurate as of August 30, 2022  8:58 AM. If you have any questions, ask your nurse or doctor.          atorvastatin 40 MG tablet Commonly known as: LIPITOR Take 1 tablet (40 mg total) by mouth daily at 6 PM.   co-enzyme Q-10 50 MG capsule Take 50 mg by mouth daily.   FISH OIL PO Take 540 mg by mouth daily.   ibuprofen 200 MG tablet Commonly known as: ADVIL Take 200 mg by mouth as needed for moderate pain.   lisinopril 20 MG tablet Commonly known as: ZESTRIL Take 1 tablet (20 mg total) by mouth daily.   loratadine 10 MG tablet Commonly known as: CLARITIN Take 10 mg by mouth daily as needed for allergies.   ONE-A-DAY 50 PLUS PO Take by mouth.         Objective:   BP 128/69   Pulse 70   Temp 98 F (36.7 C)   Ht 5' 5" (1.651 m)   Wt 162 lb (73.5 kg)   SpO2 95%   BMI 26.96 kg/m   Wt Readings from Last 3 Encounters:  08/30/22 162 lb (73.5 kg)  08/30/21 163 lb (73.9 kg)  08/26/20 158 lb 8 oz (71.9 kg)    Physical Exam Vitals and   nursing note reviewed.  Constitutional:      General: She is not in acute distress.    Appearance: She is well-developed. She is not diaphoretic.  Eyes:     Conjunctiva/sclera: Conjunctivae normal.  Cardiovascular:     Rate and Rhythm: Normal rate and regular rhythm.     Heart sounds: Normal heart sounds. No murmur heard. Pulmonary:     Effort: Pulmonary effort is normal. No respiratory distress.     Breath sounds: Normal breath sounds. No wheezing.  Musculoskeletal:        General: No tenderness. Normal range of motion.  Skin:    General: Skin is warm and dry.     Findings: No rash.  Neurological:     Mental Status: She is alert and oriented to person, place, and time.     Coordination:  Coordination normal.  Psychiatric:        Behavior: Behavior normal.       Assessment & Plan:   Problem List Items Addressed This Visit       Cardiovascular and Mediastinum   Hypertension   Relevant Medications   atorvastatin (LIPITOR) 40 MG tablet   lisinopril (ZESTRIL) 20 MG tablet   Other Relevant Orders   CMP14+EGFR     Digestive   GERD (gastroesophageal reflux disease)   Relevant Orders   CBC with Differential/Platelet     Other   Hyperlipidemia - Primary   Relevant Medications   atorvastatin (LIPITOR) 40 MG tablet   lisinopril (ZESTRIL) 20 MG tablet   Other Relevant Orders   CBC with Differential/Platelet   CMP14+EGFR   Lipid panel   Other Visit Diagnoses     Postmenopausal       Relevant Orders   DG [facility] DEXA   Essential hypertension       Relevant Medications   atorvastatin (LIPITOR) 40 MG tablet   lisinopril (ZESTRIL) 20 MG tablet   Other Relevant Orders   CBC with Differential/Platelet   CMP14+EGFR   Lipid panel       We will check blood work, continue current medicine, no changes. She will try new bone density on the way. Follow up plan: Return in about 1 year (around 08/31/2023), or if symptoms worsen or fail to improve, for Hypertension and cholesterol.  Counseling provided for all of the vaccine components Orders Placed This Encounter  Procedures   DG [facility] DEXA   CBC with Differential/Platelet   CMP14+EGFR   Lipid panel    Caryl Pina, MD [facility] 08/30/2022, 8:58 AM

## 2022-08-31 DIAGNOSIS — M85852 Other specified disorders of bone density and structure, left thigh: Secondary | ICD-10-CM | POA: Diagnosis not present

## 2022-08-31 LAB — CBC WITH DIFFERENTIAL/PLATELET
Basophils Absolute: 0.1 10*3/uL (ref 0.0–0.2)
Basos: 1 %
EOS (ABSOLUTE): 0.1 10*3/uL (ref 0.0–0.4)
Eos: 1 %
Hematocrit: 41.7 % (ref 34.0–46.6)
Hemoglobin: 14 g/dL (ref 11.1–15.9)
Immature Grans (Abs): 0 10*3/uL (ref 0.0–0.1)
Immature Granulocytes: 0 %
Lymphocytes Absolute: 2.3 10*3/uL (ref 0.7–3.1)
Lymphs: 36 %
MCH: 30 pg (ref 26.6–33.0)
MCHC: 33.6 g/dL (ref 31.5–35.7)
MCV: 90 fL (ref 79–97)
Monocytes Absolute: 0.6 10*3/uL (ref 0.1–0.9)
Monocytes: 10 %
Neutrophils Absolute: 3.3 10*3/uL (ref 1.4–7.0)
Neutrophils: 52 %
Platelets: 263 10*3/uL (ref 150–450)
RBC: 4.66 x10E6/uL (ref 3.77–5.28)
RDW: 12.7 % (ref 11.7–15.4)
WBC: 6.3 10*3/uL (ref 3.4–10.8)

## 2022-08-31 LAB — CMP14+EGFR
ALT: 31 IU/L (ref 0–32)
AST: 26 IU/L (ref 0–40)
Albumin/Globulin Ratio: 1.5 (ref 1.2–2.2)
Albumin: 4.3 g/dL (ref 3.8–4.8)
Alkaline Phosphatase: 79 IU/L (ref 44–121)
BUN/Creatinine Ratio: 18 (ref 12–28)
BUN: 15 mg/dL (ref 8–27)
Bilirubin Total: 0.5 mg/dL (ref 0.0–1.2)
CO2: 23 mmol/L (ref 20–29)
Calcium: 10.2 mg/dL (ref 8.7–10.3)
Chloride: 104 mmol/L (ref 96–106)
Creatinine, Ser: 0.82 mg/dL (ref 0.57–1.00)
Globulin, Total: 2.8 g/dL (ref 1.5–4.5)
Glucose: 105 mg/dL — ABNORMAL HIGH (ref 70–99)
Potassium: 4.6 mmol/L (ref 3.5–5.2)
Sodium: 140 mmol/L (ref 134–144)
Total Protein: 7.1 g/dL (ref 6.0–8.5)
eGFR: 75 mL/min/{1.73_m2} (ref >59)

## 2022-08-31 LAB — LIPID PANEL
Chol/HDL Ratio: 4.6 ratio — ABNORMAL HIGH (ref 0.0–4.4)
Cholesterol, Total: 195 mg/dL (ref 100–199)
HDL: 42 mg/dL (ref >39)
LDL Chol Calc (NIH): 114 mg/dL — ABNORMAL HIGH (ref 0–99)
Triglycerides: 226 mg/dL — ABNORMAL HIGH (ref 0–149)
VLDL Cholesterol Cal: 39 mg/dL (ref 5–40)

## 2022-09-01 ENCOUNTER — Other Ambulatory Visit: Payer: Self-pay

## 2022-09-01 ENCOUNTER — Telehealth: Payer: Self-pay

## 2022-09-01 DIAGNOSIS — E782 Mixed hyperlipidemia: Secondary | ICD-10-CM

## 2022-09-01 DIAGNOSIS — I10 Essential (primary) hypertension: Secondary | ICD-10-CM

## 2022-09-01 NOTE — Telephone Encounter (Signed)
After informing pt of labs results today she wishes to return in 62mto re check levels. Future orders placed and pt will return in 666mor labs only.

## 2022-09-25 ENCOUNTER — Encounter: Payer: Self-pay | Admitting: Family Medicine

## 2022-10-19 ENCOUNTER — Other Ambulatory Visit: Payer: Self-pay | Admitting: Family Medicine

## 2022-10-19 DIAGNOSIS — Z1231 Encounter for screening mammogram for malignant neoplasm of breast: Secondary | ICD-10-CM

## 2022-10-27 DIAGNOSIS — H5213 Myopia, bilateral: Secondary | ICD-10-CM | POA: Diagnosis not present

## 2022-10-27 DIAGNOSIS — H40053 Ocular hypertension, bilateral: Secondary | ICD-10-CM | POA: Diagnosis not present

## 2022-11-07 ENCOUNTER — Telehealth: Payer: Self-pay | Admitting: Family Medicine

## 2022-11-07 NOTE — Telephone Encounter (Signed)
Left message for patient to call back and schedule Medicare Annual Wellness Visit (AWV) to be completed by video or phone.   Last AWV: 11/25/2021   Please schedule at anytime with [place]     45 minute appointment  Any questions, please contact me at [phone]

## 2022-11-13 ENCOUNTER — Ambulatory Visit
Admission: RE | Admit: 2022-11-13 | Discharge: 2022-11-13 | Disposition: A | Discharge status: Home / Self Care | Payer: PPO | Source: Ambulatory Visit | Attending: Family Medicine | Admitting: Family Medicine

## 2022-11-13 DIAGNOSIS — Z1231 Encounter for screening mammogram for malignant neoplasm of breast: Secondary | ICD-10-CM | POA: Diagnosis not present

## 2022-12-13 ENCOUNTER — Ambulatory Visit (INDEPENDENT_AMBULATORY_CARE_PROVIDER_SITE_OTHER): Payer: PPO

## 2022-12-13 VITALS — Ht 65.0 in | Wt 162.0 lb

## 2022-12-13 DIAGNOSIS — Z0000 Encounter for general adult medical examination without abnormal findings: Secondary | ICD-10-CM

## 2022-12-13 NOTE — Progress Notes (Signed)
Subjective:   Teresa Patel is a 71 y.o. female who presents for Medicare Annual (Subsequent) preventive examination.  I connected with  Teresa Patel on 12/13/22 by a audio enabled telemedicine application and verified that I am speaking with the correct person using two identifiers.  Patient Location: Home  Provider Location: Home Office  I discussed the limitations of evaluation and management by telemedicine. The patient expressed understanding and agreed to proceed.  Review of Systems     Cardiac Risk Factors include: advanced age (>10mn, >>38women);dyslipidemia     Objective:    Today's Vitals   12/13/22 0845  Weight: 162 lb (73.5 kg)  Height: '5\' 5"'$  (1.651 m)   Body mass index is 26.96 kg/m.     12/13/2022    9:12 AM 11/25/2021    9:02 AM 11/24/2020    9:07 AM 10/23/2019    9:05 AM 10/15/2017    8:36 AM 03/21/2017    9:27 AM 03/13/2016    9:09 AM  Advanced Directives  Does Patient Have a Medical Advance Directive? Yes Yes Yes Yes Yes Yes Yes  Type of Advance Directive Living will;Healthcare Power of [place]ier FieldOut of facility DNR (pink MOST or yellow form);Living will [place] will [place]Living will [place]Living will HPret[place] PrairieLiving will [place]Living will  Does patient want to make changes to medical advance directive? No - Patient declined No - Patient declined  No - Patient declined No - Patient declined  No - Patient declined  Copy of [place] Chart? No - copy requested No - copy requested No - copy requested No - copy requested No - copy requested No - copy requested No - copy requested    Current Medications (verified) Outpatient Encounter Medications as of 12/13/2022  Medication Sig   atorvastatin (LIPITOR) 40 MG tablet Take 1 tablet (40 mg total) by mouth daily at 6 PM.   co-enzyme Q-10 50 MG  capsule Take 50 mg by mouth daily.   ibuprofen (ADVIL,MOTRIN) 200 MG tablet Take 200 mg by mouth as needed for moderate pain.    lisinopril (ZESTRIL) 20 MG tablet Take 1 tablet (20 mg total) by mouth daily.   loratadine (CLARITIN) 10 MG tablet Take 10 mg by mouth daily as needed for allergies.   Multiple Vitamins-Minerals (ONE-A-DAY 50 PLUS PO) Take by mouth.   Omega-3 Fatty Acids (FISH OIL PO) Take 540 mg by mouth daily.   No facility-administered encounter medications on file as of 12/13/2022.    Allergies (verified) Patient has no known allergies.   History: Past Medical History:  Diagnosis Date   Anxiety    GERD (gastroesophageal reflux disease)    Hyperlipidemia    Hypertension    Shingles    Past Surgical History:  Procedure Laterality Date   ABDOMINAL HYSTERECTOMY     carpal tunnel Bilateral    COLONOSCOPY N/A 03/21/2017   Procedure: COLONOSCOPY;  Surgeon: NRogene Houston MD;  [facility];  Service: Endoscopy;  Laterality: N/A;  1030   POLYPECTOMY  03/21/2017   Procedure: POLYPECTOMY;  Surgeon: NRogene Houston MD;  Location: [facility];  Service: Endoscopy;;  colon   Family History  Problem Relation Age of Onset   Heart disease Mother    Heart disease Father    Cancer Father        prostate   Stroke Sister  Melanoma Daughter 9   Cancer Maternal Grandmother        unknown   Stroke Paternal Grandfather    Cancer Brother        pancreatic   Heart disease Brother        heart attack   Heart disease Brother    Diabetes Brother    Kidney disease Brother    Cancer Brother        prostate   Colon cancer Neg Hx    Breast cancer Neg Hx    Social History   Socioeconomic History   Marital status: Married    Spouse name: Mikeal Hawthorne   Number of children: 3   Years of education: 12   Highest education level: High school graduate  Occupational History   Occupation: Retired    Comment: Art gallery manager  Tobacco Use   Smoking status: Never     Passive exposure: Yes   Smokeless tobacco: Never  Vaping Use   Vaping Use: Never used  Substance and Sexual Activity   Alcohol use: No   Drug use: No   Sexual activity: Yes  Other Topics Concern   Not on file  Social History Narrative   Not on file   Social Determinants of Health   Financial Resource Strain: Low Risk  (12/13/2022)   Overall Financial Resource Strain (CARDIA)    Difficulty of Paying Living Expenses: Not hard at all  Food Insecurity: No Food Insecurity (12/13/2022)   Hunger Vital Sign    Worried About Running Out of Food in the Last Year: Never true    [place] in the Last Year: Never true  Transportation Needs: No Transportation Needs (12/13/2022)   PRAPARE - Hydrologist (Medical): No    Lack of Transportation (Non-Medical): No  Physical Activity: Sufficiently Active (12/13/2022)   Exercise Vital Sign    Days of Exercise per Week: 5 days    Minutes of Exercise per Session: 30 min  Stress: No Stress Concern Present (12/13/2022)   [place]    Feeling of Stress : Not at all  Social Connections: [place] (12/13/2022)   Social Connection and Isolation Panel [NHANES]    Frequency of Communication with Friends and Family: More than three times a week    Frequency of Social Gatherings with Friends and Family: Three times a week    Attends Religious Services: More than 4 times per year    Active Member of Clubs or Organizations: Yes    Attends Music therapist: More than 4 times per year    Marital Status: Married    Tobacco Counseling Counseling given: Not Answered   Clinical Intake:  Pre-visit preparation completed: Yes  Pain : No/denies pain  Diabetes: No  How often do you need to have someone help you when you read instructions, pamphlets, or other written materials from your doctor or pharmacy?: 1 -  Never  Diabetic?No  Interpreter Needed?: No  Information entered by :: Denman George LPN   Activities of Daily Living    12/13/2022    9:12 AM  In your present state of health, do you have any difficulty performing the following activities:  Hearing? 0  Vision? 0  Difficulty concentrating or making decisions? 0  Walking or climbing stairs? 0  Dressing or bathing? 0  Doing errands, shopping? 0  Preparing Food and eating ? N  Using the Toilet? N  In the past six months, have you accidently leaked urine? N  Do you have problems with loss of bowel control? N  Managing your Medications? N  Managing your Finances? N  Housekeeping or managing your Housekeeping? N    Patient Care Team: Dettinger, Fransisca Kaufmann, MD as PCP - General (Family Medicine) Rogene Houston, MD as Consulting Physician (Gastroenterology) Gari Crown, MD as Referring Physician (Obstetrics and Gynecology) Leticia Clas, OD (Optometry)  Indicate any recent Medical Services you may have received from other than [facility] providers in the past year (date may be approximate).     Assessment:   This is a routine wellness examination for Teresa Patel.  Hearing/Vision screen Hearing Screening - Comments:: Denies hearing difficulties  Vision Screening - Comments:: Wears rx glasses - up to date with routine eye exams with Dr. Leticia Clas    Dietary issues and exercise activities discussed: Current Exercise Habits: Home exercise routine, Type of exercise: walking, Time (Minutes): 30, Frequency (Times/Week): 6, Weekly Exercise (Minutes/Week): 180, Intensity: Mild   Goals Addressed             This Visit's Progress    DIET - EAT MORE FRUITS AND VEGETABLES   On track    DIET - INCREASE WATER INTAKE   On track     Depression Screen    12/13/2022    9:10 AM 08/30/2022    8:29 AM 11/25/2021    8:35 AM 08/30/2021    8:15 AM 11/24/2020    9:13 AM 08/26/2020    3:26 PM 10/23/2019    9:05 AM  PHQ 2/9 Scores  PHQ - 2  Score 0 0 0 0 0 0 0    Fall Risk    12/13/2022    9:08 AM 08/30/2022    8:29 AM 11/25/2021    8:34 AM 08/30/2021    8:15 AM 11/24/2020    9:13 AM  Fall Risk   Falls in the past year? 0 0 1 0 1  Number falls in past yr: 0  0  0  Injury with Fall? 0  0  0  Risk for fall due to : No Fall Risks  History of fall(s)  No Fall Risks  Follow up Falls evaluation completed;Education provided;Falls prevention discussed  Falls prevention discussed      FALL RISK PREVENTION PERTAINING TO THE HOME:  Any stairs in or around the home? Yes  If so, are there any without handrails? No  Home free of loose throw rugs in walkways, pet beds, electrical cords, etc? Yes  Adequate lighting in your home to reduce risk of falls? Yes   ASSISTIVE DEVICES UTILIZED TO PREVENT FALLS:  Life alert? No  Use of a cane, walker or w/c? No  Grab bars in the bathroom? Yes  Shower chair or bench in shower? No  Elevated toilet seat or a handicapped toilet? Yes   TIMED UP AND GO:  Was the test performed? No . Telephonic visit   Cognitive Function:    10/15/2017    8:48 AM 03/13/2016    9:21 AM  MMSE - Mini Mental State Exam  Orientation to time 5 5  Orientation to Place 5 5  Registration 3 3  Attention/ Calculation 5 5  Recall 3 3  Language- name 2 objects 2 2  Language- repeat 1 1  Language- follow 3 step command 3 3  Language- read & follow direction 1 1  Write a sentence 1 1  Copy design 1 1  Total score 30 30        12/13/2022    9:12 AM 11/25/2021    9:05 AM 11/25/2021    8:41 AM 11/24/2020    9:10 AM 10/23/2019    9:13 AM  6CIT Screen  What Year? 0 points 0 points 0 points 0 points 0 points  What month? 0 points 0 points 0 points 0 points 0 points  What time? 0 points 0 points 0 points 0 points 0 points  Count back from 20 0 points 0 points 0 points 0 points 0 points  Months in reverse 0 points 0 points  0 points 0 points  Repeat phrase 0 points 0 points  0 points 0 points  Total Score 0  points 0 points  0 points 0 points    Immunizations Immunization History  Administered Date(s) Administered   Fluad Quad(high Dose 65+) 11/26/2020, 10/25/2021   Influenza Whole 10/25/2012   Influenza, High Dose Seasonal PF 10/06/2016, 10/15/2017, 10/21/2018, 11/08/2019   Influenza,inj,Quad PF,6+ Mos 11/28/2013, 10/27/2014, 10/19/2015   Influenza-Unspecified 11/20/2022   Moderna SARS-COV2 Booster Vaccination 12/07/2020   Moderna Sars-Covid-2 Vaccination 01/09/2020, 02/09/2020   Pneumococcal Conjugate-13 07/14/2014, 04/11/2017   Pneumococcal Polysaccharide-23 07/29/2015   Td 01/27/2008   Tdap 11/08/2019    TDAP status: Up to date  Flu Vaccine status: Up to date  Pneumococcal vaccine status: Up to date  Covid-19 vaccine status: Information provided on how to obtain vaccines.   Qualifies for Shingles Vaccine? Yes   Zostavax completed No   Shingrix Completed?: No.    Education has been provided regarding the importance of this vaccine. Patient has been advised to call insurance company to determine out of pocket expense if they have not yet received this vaccine. Advised may also receive vaccine at local pharmacy or Health Dept. Verbalized acceptance and understanding.  Screening Tests Health Maintenance  Topic Date Due   COVID-19 Vaccine (4 - 2023-24 season) 08/18/2022   Zoster Vaccines- Shingrix (1 of 2) 09/03/2023 (Originally [birth year]1999)   Medicare Annual Wellness (AWV)  12/14/2023   MAMMOGRAM  11/13/2024   DEXA SCAN  08/31/2025   COLONOSCOPY (Pts 45-71yr Insurance coverage will need to be confirmed)  03/22/2027   DTaP/Tdap/Td (3 - Td or Tdap) 11/07/2029   Pneumonia Vaccine 71 Years old  Completed   INFLUENZA VACCINE  Completed   Hepatitis C Screening  Completed   HPV VACCINES  Aged Out    Health Maintenance  Health Maintenance Due  Topic Date Due   COVID-19 Vaccine (4 - 2023-24 season) 08/18/2022    Colorectal cancer screening: Type of screening: Colonoscopy.  Completed 03/21/17. Repeat every 10 years  Mammogram status: Completed 11/13/22. Repeat every year  Bone Density status: Completed 08/31/22. Results reflect: Bone density results: OSTEOPENIA. Repeat every 2 years.  Lung Cancer Screening: (Low Dose CT Chest recommended if Age 73-80years, 30 pack-year currently smoking OR have quit w/in 15years.) does not qualify.   Lung Cancer Screening Referral: n/a  Additional Screening:  Hepatitis C Screening: does qualify; Completed 04/11/17  Vision Screening: Recommended annual ophthalmology exams for early detection of glaucoma and other disorders of the eye. Is the patient up to date with their annual eye exam?  Yes  Who is the provider or what is the name of the office in which the patient attends annual eye exams? Dr. RLeticia Clas If pt is not established with a provider, would they like to be referred to a provider to establish care? No .  Dental Screening: Recommended annual dental exams for proper oral hygiene  Community Resource Referral / Chronic Care Management: CRR required this visit?  No   CCM required this visit?  No      Plan:     I have personally reviewed and noted the following in the patient's chart:   Medical and social history Use of alcohol, tobacco or illicit drugs  Current medications and supplements including opioid prescriptions. Patient is not currently taking opioid prescriptions. Functional ability and status Nutritional status Physical activity Advanced directives List of other physicians Hospitalizations, surgeries, and ER visits in previous 12 months Vitals Screenings to include cognitive, depression, and falls Referrals and appointments  In addition, I have reviewed and discussed with patient certain preventive protocols, quality metrics, and best practice recommendations. A written personalized care plan for preventive services as well as general preventive health recommendations were provided to  patient.     Vanetta Mulders, [place]   16/09/9603   Due to this being a virtual visit, the after visit summary with patients personalized plan was offered to patient via mail or my-chart. Patient would like to access on my-chart  Nurse Notes: No Concerns

## 2022-12-13 NOTE — Patient Instructions (Signed)
Teresa Patel , Thank you for taking time to come for your Medicare Wellness Visit. I appreciate your ongoing commitment to your health goals. Please review the following plan we discussed and let me know if I can assist you in the future.   These are the goals we discussed:  Goals      awv     11/24/2020 AWV Goal: Fall Prevention  Over the next year, patient will decrease their risk for falls by: Using assistive devices, such as a cane or walker, as needed Identifying fall risks within their home and correcting them by: Removing throw rugs Adding handrails to stairs or ramps Removing clutter and keeping a clear pathway throughout the home Increasing light, especially at night Adding shower handles/bars Raising toilet seat Identifying potential personal risk factors for falls: Medication side effects Incontinence/urgency Vestibular dysfunction Hearing loss Musculoskeletal disorders Neurological disorders Orthostatic hypotension  .awv     DIET - EAT MORE FRUITS AND VEGETABLES     DIET - INCREASE WATER INTAKE     DIET - INCREASE WATER INTAKE     Exercise 150 min/wk Moderate Activity     Weight (lb) < 150 lb (68 kg)        This is a list of the screening recommended for you and due dates:  Health Maintenance  Topic Date Due   COVID-19 Vaccine (4 - 2023-24 season) 08/18/2022   Zoster (Shingles) Vaccine (1 of 2) 09/03/2023*   Medicare Annual Wellness Visit  12/14/2023   Mammogram  11/13/2024   DEXA scan (bone density measurement)  08/31/2025   Colon Cancer Screening  03/22/2027   DTaP/Tdap/Td vaccine (3 - Td or Tdap) 11/07/2029   Pneumonia Vaccine  Completed   Flu Shot  Completed   Hepatitis C Screening: USPSTF Recommendation to screen - Ages 18-79 yo.  Completed   HPV Vaccine  Aged Out  *Topic was postponed. The date shown is not the original due date.    Advanced directives: Please bring a copy of your health care power of attorney and living will to the office to be  added to your chart at your convenience.   Conditions/risks identified: Aim for 30 minutes of exercise or brisk walking, 6-8 glasses of water, and 5 servings of fruits and vegetables each day.   Next appointment: Follow up in one year for your annual wellness visit    Preventive Care 65 Years and Older, Female Preventive care refers to lifestyle choices and visits with your health care provider that can promote health and wellness. What does preventive care include? A yearly physical exam. This is also called an annual well check. Dental exams once or twice a year. Routine eye exams. Ask your health care provider how often you should have your eyes checked. Personal lifestyle choices, including: Daily care of your teeth and gums. Regular physical activity. Eating a healthy diet. Avoiding tobacco and drug use. Limiting alcohol use. Practicing safe sex. Taking low-dose aspirin every day. Taking vitamin and mineral supplements as recommended by your health care provider. What happens during an annual well check? The services and screenings done by your health care provider during your annual well check will depend on your age, overall health, lifestyle risk factors, and family history of disease. Counseling  Your health care provider may ask you questions about your: Alcohol use. Tobacco use. Drug use. Emotional well-being. Home and relationship well-being. Sexual activity. Eating habits. History of falls. Memory and ability to understand (cognition). Work and work Statistician.  Reproductive health. Screening  You may have the following tests or measurements: Height, weight, and BMI. Blood pressure. Lipid and cholesterol levels. These may be checked every 5 years, or more frequently if you are over 70 years old. Skin check. Lung cancer screening. You may have this screening every year starting at age 53 if you have a 30-pack-year history of smoking and currently smoke or have  quit within the past 15 years. Fecal occult blood test (FOBT) of the stool. You may have this test every year starting at age 49. Flexible sigmoidoscopy or colonoscopy. You may have a sigmoidoscopy every 5 years or a colonoscopy every 10 years starting at age 34. Hepatitis C blood test. Hepatitis B blood test. Sexually transmitted disease (STD) testing. Diabetes screening. This is done by checking your blood sugar (glucose) after you have not eaten for a while (fasting). You may have this done every 1-3 years. Bone density scan. This is done to screen for osteoporosis. You may have this done starting at age 58. Mammogram. This may be done every 1-2 years. Talk to your health care provider about how often you should have regular mammograms. Talk with your health care provider about your test results, treatment options, and if necessary, the need for more tests. Vaccines  Your health care provider may recommend certain vaccines, such as: Influenza vaccine. This is recommended every year. Tetanus, diphtheria, and acellular pertussis (Tdap, Td) vaccine. You may need a Td booster every 10 years. Zoster vaccine. You may need this after age 34. Pneumococcal 13-valent conjugate (PCV13) vaccine. One dose is recommended after age 80. Pneumococcal polysaccharide (PPSV23) vaccine. One dose is recommended after age 41. Talk to your health care provider about which screenings and vaccines you need and how often you need them. This information is not intended to replace advice given to you by your health care provider. Make sure you discuss any questions you have with your health care provider. Document Released: 12/31/2015 Document Revised: 08/23/2016 Document Reviewed: 10/05/2015 Elsevier Interactive Patient Education  2017 [employer] Prevention in the Home Falls can cause injuries. They can happen to people of all ages. There are many things you can do to make your home safe and to help prevent  falls. What can I do on the outside of my home? Regularly fix the edges of walkways and driveways and fix any cracks. Remove anything that might make you trip as you walk through a door, such as a raised step or threshold. Trim any bushes or trees on the path to your home. Use bright outdoor lighting. Clear any walking paths of anything that might make someone trip, such as rocks or tools. Regularly check to see if handrails are loose or broken. Make sure that both sides of any steps have handrails. Any raised decks and porches should have guardrails on the edges. Have any leaves, snow, or ice cleared regularly. Use sand or salt on walking paths during winter. Clean up any spills in your garage right away. This includes oil or grease spills. What can I do in the bathroom? Use night lights. Install grab bars by the toilet and in the tub and shower. Do not use towel bars as grab bars. Use non-skid mats or decals in the tub or shower. If you need to sit down in the shower, use a plastic, non-slip stool. Keep the floor dry. Clean up any water that spills on the floor as soon as it happens. Remove soap buildup in the  tub or shower regularly. Attach bath mats securely with double-sided non-slip rug tape. Do not have throw rugs and other things on the floor that can make you trip. What can I do in the bedroom? Use night lights. Make sure that you have a light by your bed that is easy to reach. Do not use any sheets or blankets that are too big for your bed. They should not hang down onto the floor. Have a firm chair that has side arms. You can use this for support while you get dressed. Do not have throw rugs and other things on the floor that can make you trip. What can I do in the kitchen? Clean up any spills right away. Avoid walking on wet floors. Keep items that you use a lot in easy-to-reach places. If you need to reach something above you, use a strong step stool that has a grab  bar. Keep electrical cords out of the way. Do not use floor polish or wax that makes floors slippery. If you must use wax, use non-skid floor wax. Do not have throw rugs and other things on the floor that can make you trip. What can I do with my stairs? Do not leave any items on the stairs. Make sure that there are handrails on both sides of the stairs and use them. Fix handrails that are broken or loose. Make sure that handrails are as long as the stairways. Check any carpeting to make sure that it is firmly attached to the stairs. Fix any carpet that is loose or worn. Avoid having throw rugs at the top or bottom of the stairs. If you do have throw rugs, attach them to the floor with carpet tape. Make sure that you have a light switch at the top of the stairs and the bottom of the stairs. If you do not have them, ask someone to add them for you. What else can I do to help prevent falls? Wear shoes that: Do not have high heels. Have rubber bottoms. Are comfortable and fit you well. Are closed at the toe. Do not wear sandals. If you use a stepladder: Make sure that it is fully opened. Do not climb a closed stepladder. Make sure that both sides of the stepladder are locked into place. Ask someone to hold it for you, if possible. Clearly mark and make sure that you can see: Any grab bars or handrails. First and last steps. Where the edge of each step is. Use tools that help you move around (mobility aids) if they are needed. These include: Canes. Walkers. Scooters. Crutches. Turn on the lights when you go into a dark area. Replace any light bulbs as soon as they burn out. Set up your furniture so you have a clear path. Avoid moving your furniture around. If any of your floors are uneven, fix them. If there are any pets around you, be aware of where they are. Review your medicines with your doctor. Some medicines can make you feel dizzy. This can increase your chance of falling. Ask  your doctor what other things that you can do to help prevent falls. This information is not intended to replace advice given to you by your health care provider. Make sure you discuss any questions you have with your health care provider. Document Released: 09/30/2009 Document Revised: 05/11/2016 Document Reviewed: 01/08/2015 Elsevier Interactive Patient Education  2017 [employer].

## 2023-02-07 ENCOUNTER — Telehealth: Payer: Self-pay | Admitting: Family Medicine

## 2023-02-07 NOTE — Telephone Encounter (Signed)
  Called patient to schedule Medicare Annual Wellness Visit (AWV). Left message for patient to call back and schedule Medicare Annual Wellness Visit (AWV).  Last date of AWV: 12/13/2022   Please schedule an appointment at any time with either Mickel Baas or [place], [facility]. .  If any questions, please contact me at [phone].  Thank you,  Colletta Maryland,  [facility] Direct Dial ??[phone]

## 2023-02-08 ENCOUNTER — Telehealth: Payer: Self-pay | Admitting: Family Medicine

## 2023-02-08 NOTE — Telephone Encounter (Signed)
Contacted Teresa Patel to schedule their annual wellness visit. Appointment made for 12/17/2023.  Thank you,  Colletta Maryland,  [facility] Direct Dial ??[phone]

## 2023-02-09 ENCOUNTER — Ambulatory Visit (INDEPENDENT_AMBULATORY_CARE_PROVIDER_SITE_OTHER): Payer: PPO | Admitting: Family Medicine

## 2023-02-09 ENCOUNTER — Encounter: Payer: Self-pay | Admitting: Family Medicine

## 2023-02-09 VITALS — BP 133/77 | HR 79 | Ht 65.0 in | Wt 162.0 lb

## 2023-02-09 DIAGNOSIS — N8110 Cystocele, unspecified: Secondary | ICD-10-CM

## 2023-02-09 NOTE — Progress Notes (Signed)
BP 133/77   Pulse 79   Ht '5\' 5"'$  (1.651 m)   Wt 162 lb (73.5 kg)   SpO2 99%   BMI 26.96 kg/m    Subjective:   Patient ID: Teresa Patel, female    DOB: 1951-11-26, 72 y.o.   MRN: [id number]  HPI: Teresa Patel is a 72 y.o. female presenting on 02/09/2023 for Bladder Prolapse (Some pain with lifting/ hips hurt as well)   HPI Patient is coming in for bladder prolapse.  She feels like her bladder is hanging down more and she is not emptying completely and then she ends up with a urinary odor and is just been worsening.  She did have her bladder tacked up a little bit when she had a hysterectomy about 25 years ago but is just been gradually worsening.  She also has some urinary frequency associated with that.  Relevant past medical, surgical, family and social history reviewed and updated as indicated. Interim medical history since our last visit reviewed. Allergies and medications reviewed and updated.  Review of Systems  Constitutional:  Negative for chills and fever.  Eyes:  Negative for visual disturbance.  Respiratory:  Negative for chest tightness and shortness of breath.   Cardiovascular:  Negative for chest pain and leg swelling.  Gastrointestinal:  Negative for abdominal pain.  Genitourinary:  Positive for frequency. Negative for difficulty urinating, dysuria and urgency.  Musculoskeletal:  Negative for back pain and gait problem.  Skin:  Negative for rash.  Neurological:  Negative for light-headedness and headaches.  Psychiatric/Behavioral:  Negative for agitation and behavioral problems.   All other systems reviewed and are negative.   Per HPI unless specifically indicated above   Allergies as of 02/09/2023   No Known Allergies      Medication List        Accurate as of February 09, 2023 11:37 AM. If you have any questions, ask your nurse or doctor.          atorvastatin 40 MG tablet Commonly known as: LIPITOR Take 1 tablet (40 mg total) by mouth daily at  6 PM.   co-enzyme Q-10 50 MG capsule Take 50 mg by mouth daily.   FISH OIL PO Take 540 mg by mouth daily.   ibuprofen 200 MG tablet Commonly known as: ADVIL Take 200 mg by mouth as needed for moderate pain.   lisinopril 20 MG tablet Commonly known as: ZESTRIL Take 1 tablet (20 mg total) by mouth daily.   loratadine 10 MG tablet Commonly known as: CLARITIN Take 10 mg by mouth daily as needed for allergies.   ONE-A-DAY 50 PLUS PO Take by mouth.         Objective:   BP 133/77   Pulse 79   Ht '5\' 5"'$  (1.651 m)   Wt 162 lb (73.5 kg)   SpO2 99%   BMI 26.96 kg/m   Wt Readings from Last 3 Encounters:  02/09/23 162 lb (73.5 kg)  12/13/22 162 lb (73.5 kg)  08/30/22 162 lb (73.5 kg)    Physical Exam Vitals and nursing note reviewed. Exam conducted with a chaperone present.  Constitutional:      General: She is not in acute distress.    Appearance: She is well-developed. She is not diaphoretic.  Eyes:     Conjunctiva/sclera: Conjunctivae normal.  Genitourinary:    Exam position: Lithotomy position.     Labia:        Right: No tenderness or lesion.  Left: No tenderness or lesion.      Urethra: Prolapse present. No urethral pain, urethral swelling or urethral lesion.     Uterus: Absent.      Adnexa:        Right: No mass or tenderness.         Left: No mass or tenderness.    Skin:    General: Skin is warm and dry.     Findings: No rash.  Neurological:     Mental Status: She is alert and oriented to person, place, and time.     Coordination: Coordination normal.  Psychiatric:        Behavior: Behavior normal.       Assessment & Plan:   Problem List Items Addressed This Visit   None Visit Diagnoses     Bladder prolapse, female, acquired    -  Primary   Relevant Orders   Ambulatory referral to Gynecology       Prolapsed bladder, referred to gynecology, discussed options versus surgery versus pessary and she will discuss it further with gynecology  but is leaning towards possible surgery Follow up plan: Return if symptoms worsen or fail to improve.  Counseling provided for all of the vaccine components Orders Placed This Encounter  Procedures   Ambulatory referral to Gynecology    Caryl Pina, MD [facility] 02/09/2023, 11:37 AM

## 2023-02-22 DIAGNOSIS — R339 Retention of urine, unspecified: Secondary | ICD-10-CM | POA: Diagnosis not present

## 2023-02-22 DIAGNOSIS — N8110 Cystocele, unspecified: Secondary | ICD-10-CM | POA: Diagnosis not present

## 2023-02-22 DIAGNOSIS — Z6828 Body mass index (BMI) 28.0-28.9, adult: Secondary | ICD-10-CM | POA: Diagnosis not present

## 2023-04-02 DIAGNOSIS — N8110 Cystocele, unspecified: Secondary | ICD-10-CM | POA: Diagnosis not present

## 2023-04-18 DIAGNOSIS — N952 Postmenopausal atrophic vaginitis: Secondary | ICD-10-CM | POA: Diagnosis not present

## 2023-04-18 DIAGNOSIS — Z4689 Encounter for fitting and adjustment of other specified devices: Secondary | ICD-10-CM | POA: Diagnosis not present

## 2023-04-18 DIAGNOSIS — N8110 Cystocele, unspecified: Secondary | ICD-10-CM | POA: Diagnosis not present

## 2023-07-13 ENCOUNTER — Encounter: Payer: Self-pay | Admitting: Family Medicine

## 2023-07-13 DIAGNOSIS — I10 Essential (primary) hypertension: Secondary | ICD-10-CM

## 2023-07-13 DIAGNOSIS — E782 Mixed hyperlipidemia: Secondary | ICD-10-CM

## 2023-07-13 MED ORDER — ATORVASTATIN CALCIUM 40 MG PO TABS
40.0000 mg | ORAL_TABLET | Freq: Every day | ORAL | 0 refills | Status: DC
Start: 2023-07-13 — End: 2023-10-23

## 2023-07-13 MED ORDER — LISINOPRIL 20 MG PO TABS: 20.0000 mg | ORAL_TABLET | Freq: Every day | ORAL | 0 refills | Status: DC

## 2023-07-16 ENCOUNTER — Other Ambulatory Visit: Payer: Self-pay | Admitting: Family Medicine

## 2023-07-16 DIAGNOSIS — I10 Essential (primary) hypertension: Secondary | ICD-10-CM

## 2023-07-16 DIAGNOSIS — E782 Mixed hyperlipidemia: Secondary | ICD-10-CM

## 2023-08-29 ENCOUNTER — Encounter: Payer: Self-pay | Admitting: Family Medicine

## 2023-09-03 ENCOUNTER — Ambulatory Visit: Payer: PPO | Admitting: Family Medicine

## 2023-09-06 ENCOUNTER — Encounter: Payer: PPO | Admitting: Family Medicine

## 2023-09-23 ENCOUNTER — Encounter: Payer: Self-pay | Admitting: Pharmacist

## 2023-09-23 NOTE — Progress Notes (Signed)
Pharmacy Quality Measure Review  This patient is appearing on a report for being at risk of failing the adherence measure for cholesterol (statin) medications this calendar year.   Medication: atorvastatin 40 mg Last fill date: 7/29 for 90 day supply  Insurance report was not up to date. No action needed at this time.   Jarrett Ables, PharmD PGY-1 Pharmacy Resident

## 2023-10-26 ENCOUNTER — Ambulatory Visit (INDEPENDENT_AMBULATORY_CARE_PROVIDER_SITE_OTHER): Payer: PPO | Admitting: Family Medicine

## 2023-10-26 ENCOUNTER — Encounter: Payer: Self-pay | Admitting: Family Medicine

## 2023-10-26 VITALS — BP 124/72 | HR 71 | Temp 98.3°F | Ht 65.0 in | Wt 165.0 lb

## 2023-10-26 DIAGNOSIS — E782 Mixed hyperlipidemia: Secondary | ICD-10-CM

## 2023-10-26 DIAGNOSIS — M7581 Other shoulder lesions, right shoulder: Secondary | ICD-10-CM | POA: Diagnosis not present

## 2023-10-26 DIAGNOSIS — I10 Essential (primary) hypertension: Secondary | ICD-10-CM | POA: Diagnosis not present

## 2023-10-26 DIAGNOSIS — Z23 Encounter for immunization: Secondary | ICD-10-CM | POA: Diagnosis not present

## 2023-10-26 DIAGNOSIS — Z0001 Encounter for general adult medical examination with abnormal findings: Secondary | ICD-10-CM | POA: Diagnosis not present

## 2023-10-26 DIAGNOSIS — Z0000 Encounter for general adult medical examination without abnormal findings: Secondary | ICD-10-CM

## 2023-10-26 MED ORDER — LISINOPRIL 20 MG PO TABS
20.0000 mg | ORAL_TABLET | Freq: Every day | ORAL | 3 refills | Status: DC
Start: 2023-10-26 — End: 2024-10-30

## 2023-10-26 MED ORDER — ATORVASTATIN CALCIUM 40 MG PO TABS
40.0000 mg | ORAL_TABLET | Freq: Every day | ORAL | 3 refills | Status: DC
Start: 2023-10-26 — End: 2024-07-15

## 2023-10-26 NOTE — Progress Notes (Signed)
BP 124/72   Pulse 71   Temp 98.3 F (36.8 C)   Ht 5\' 5"  (1.651 m)   Wt 165 lb (74.8 kg)   SpO2 96%   BMI 27.46 kg/m    Subjective:   Patient ID: Teresa Patel, female    DOB: [birth year], 72 y.o.   MRN: [id number]  HPI: Teresa Patel is a 72 y.o. female presenting on 10/26/2023 for Medical Management of Chronic Issues (Right shoulder pain X 3 weeks/Diminished ROM)   HPI Physical exam Patient sees gynecology for her female exams and mammograms. Patient denies any chest pain, shortness of breath, headaches or vision issues, abdominal complaints, diarrhea, nausea, vomiting.  Patient's biggest issue today is that she has been having some right shoulder pain issues.  She especially has right shoulder pain issues when she was reaching forward to grab something and pole and she feels a lot but also feels it some with reaching laterally and behind.  She says sometimes when she puts it over her head at night it does feel better  Hypertension Patient is currently on lisinopril, and their blood pressure today is 124/72. Patient denies any lightheadedness or dizziness. Patient denies headaches, blurred vision, chest pains, shortness of breath, or weakness. Denies any side effects from medication and is content with current medication.   Hyperlipidemia Patient is coming in for recheck of his hyperlipidemia. The patient is currently taking fish oils and atorvastatin. They deny any issues with myalgias or history of liver damage from it. They deny any focal numbness or weakness or chest pain.   Relevant past medical, surgical, family and social history reviewed and updated as indicated. Interim medical history since our last visit reviewed. Allergies and medications reviewed and updated.  Review of Systems  Constitutional:  Negative for chills and fever.  HENT:  Negative for congestion, ear discharge and ear pain.   Eyes:  Negative for redness and visual disturbance.  Respiratory:  Negative for  chest tightness and shortness of breath.   Cardiovascular:  Negative for chest pain and leg swelling.  Genitourinary:  Negative for difficulty urinating and dysuria.  Musculoskeletal:  Positive for arthralgias. Negative for back pain and gait problem.  Skin:  Negative for rash.  Neurological:  Negative for light-headedness and headaches.  Psychiatric/Behavioral:  Negative for agitation and behavioral problems.   All other systems reviewed and are negative.   Per HPI unless specifically indicated above   Allergies as of 10/26/2023   No Known Allergies      Medication List        Accurate as of October 26, 2023  3:01 PM. If you have any questions, ask your nurse or doctor.          atorvastatin 40 MG tablet Commonly known as: LIPITOR Take 1 tablet (40 mg total) by mouth daily at 6 PM.   CALCIUM-VITAMIN D PO Take 120 mg by mouth daily.   Coenzyme Q10 100 MG Tabs Take 100 mg by mouth daily. What changed: Another medication with the same name was removed. Continue taking this medication, and follow the directions you see here. Changed by: Elige Radon Vernella Niznik   FISH OIL PO Take 2,400 mg by mouth daily.   ibuprofen 200 MG tablet Commonly known as: ADVIL Take 200 mg by mouth as needed for moderate pain.   lisinopril 20 MG tablet Commonly known as: ZESTRIL Take 1 tablet (20 mg total) by mouth daily.   loratadine 10 MG tablet Commonly known as:  CLARITIN Take 10 mg by mouth daily as needed for allergies.   Magnesium 200 MG Tabs Take 200 mg by mouth daily.   omeprazole 20 MG capsule Commonly known as: PRILOSEC Take 20 mg by mouth daily as needed.   ONE-A-DAY 50 PLUS PO Take by mouth.   PROBIOTIC-10 PO Take by mouth daily.         Objective:   BP 124/72   Pulse 71   Temp 98.3 F (36.8 C)   Ht 5\' 5"  (1.651 m)   Wt 165 lb (74.8 kg)   SpO2 96%   BMI 27.46 kg/m   Wt Readings from Last 3 Encounters:  10/26/23 165 lb (74.8 kg)  02/09/23 162 lb (73.5  kg)  12/13/22 162 lb (73.5 kg)    Physical Exam Vitals and nursing note reviewed.  Constitutional:      General: She is not in acute distress.    Appearance: She is well-developed. She is not diaphoretic.  HENT:     Right Ear: Tympanic membrane normal.     Left Ear: Tympanic membrane normal.     Mouth/Throat:     Mouth: Mucous membranes are moist.     Pharynx: Oropharynx is clear. No oropharyngeal exudate or posterior oropharyngeal erythema.  Eyes:     Conjunctiva/sclera: Conjunctivae normal.  Cardiovascular:     Rate and Rhythm: Normal rate and regular rhythm.     Heart sounds: Normal heart sounds. No murmur heard. Pulmonary:     Effort: Pulmonary effort is normal. No respiratory distress.     Breath sounds: Normal breath sounds. No wheezing.  Abdominal:     General: Abdomen is flat. Bowel sounds are normal. There is no distension.     Tenderness: There is no abdominal tenderness. There is no guarding or rebound.  Musculoskeletal:        General: Normal range of motion.     Right shoulder: Tenderness (Positive empty can and external rotation.  Negative internal rotation.  Negative Hawkins impingement) present. No bony tenderness or crepitus. Normal range of motion.     Left shoulder: No tenderness, bony tenderness or crepitus. Normal range of motion.     Cervical back: No tenderness, bony tenderness or crepitus. No pain with movement.  Skin:    General: Skin is warm and dry.     Findings: No rash.  Neurological:     Mental Status: She is alert and oriented to person, place, and time.     Coordination: Coordination normal.  Psychiatric:        Behavior: Behavior normal.       Assessment & Plan:   Problem List Items Addressed This Visit       Cardiovascular and Mediastinum   Hypertension   Relevant Medications   atorvastatin (LIPITOR) 40 MG tablet   lisinopril (ZESTRIL) 20 MG tablet     Other   Hyperlipidemia   Relevant Medications   atorvastatin (LIPITOR) 40  MG tablet   lisinopril (ZESTRIL) 20 MG tablet   Other Relevant Orders   Lipid panel   Other Visit Diagnoses     Physical exam    -  Primary   Essential hypertension       Relevant Medications   atorvastatin (LIPITOR) 40 MG tablet   lisinopril (ZESTRIL) 20 MG tablet   Other Relevant Orders   CBC with Differential/Platelet   CMP14+EGFR   Right rotator cuff tendinitis           Patient has been having  troubles with her right shoulder and it does appear that it is right rotator cuff.  It has been going on for some time.  Positive rotator cuff testing.  Will likely need further evaluation, we discussed the possibility of therapy versus orthopedic.  She would like to go see the orthopedic and then discussed possible therapy or injections with them.  She is going to call us back once she figures out which orthopedic she wants to go to. Follow up plan: Return in about 1 year (around 10/25/2024), or if symptoms worsen or fail to improve, for Physical exam.  Counseling provided for all of the vaccine components Orders Placed This Encounter  Procedures   CBC with Differential/Platelet   CMP14+EGFR   Lipid panel    Arville Care, MD [facility] 10/26/2023, 3:01 PM

## 2023-10-27 LAB — CBC WITH DIFFERENTIAL/PLATELET
Basophils Absolute: 0.1 10*3/uL (ref 0.0–0.2)
Basos: 1 %
EOS (ABSOLUTE): 0.2 10*3/uL (ref 0.0–0.4)
Eos: 2 %
Hematocrit: 42.3 % (ref 34.0–46.6)
Hemoglobin: 13.7 g/dL (ref 11.1–15.9)
Immature Grans (Abs): 0 10*3/uL (ref 0.0–0.1)
Immature Granulocytes: 0 %
Lymphocytes Absolute: 2.9 10*3/uL (ref 0.7–3.1)
Lymphs: 42 %
MCH: 29.6 pg (ref 26.6–33.0)
MCHC: 32.4 g/dL (ref 31.5–35.7)
MCV: 91 fL (ref 79–97)
Monocytes Absolute: 0.7 10*3/uL (ref 0.1–0.9)
Monocytes: 10 %
Neutrophils Absolute: 3.2 10*3/uL (ref 1.4–7.0)
Neutrophils: 45 %
Platelets: 271 10*3/uL (ref 150–450)
RBC: 4.63 x10E6/uL (ref 3.77–5.28)
RDW: 12.2 % (ref 11.7–15.4)
WBC: 7 10*3/uL (ref 3.4–10.8)

## 2023-10-27 LAB — CMP14+EGFR
ALT: 17 [IU]/L (ref 0–32)
AST: 22 [IU]/L (ref 0–40)
Albumin: 4.2 g/dL (ref 3.8–4.8)
Alkaline Phosphatase: 74 [IU]/L (ref 44–121)
BUN/Creatinine Ratio: 20 (ref 12–28)
BUN: 17 mg/dL (ref 8–27)
Bilirubin Total: 0.3 mg/dL (ref 0.0–1.2)
CO2: 22 mmol/L (ref 20–29)
Calcium: 10.2 mg/dL (ref 8.7–10.3)
Chloride: 105 mmol/L (ref 96–106)
Creatinine, Ser: 0.83 mg/dL (ref 0.57–1.00)
Globulin, Total: 2.9 g/dL (ref 1.5–4.5)
Glucose: 83 mg/dL (ref 70–99)
Potassium: 4.1 mmol/L (ref 3.5–5.2)
Sodium: 142 mmol/L (ref 134–144)
Total Protein: 7.1 g/dL (ref 6.0–8.5)
eGFR: 74 mL/min/{1.73_m2} (ref >59)

## 2023-10-27 LAB — LIPID PANEL
Chol/HDL Ratio: 4.7 ratio — ABNORMAL HIGH (ref 0.0–4.4)
Cholesterol, Total: 198 mg/dL (ref 100–199)
HDL: 42 mg/dL (ref >39)
LDL Chol Calc (NIH): 104 mg/dL — ABNORMAL HIGH (ref 0–99)
Triglycerides: 304 mg/dL — ABNORMAL HIGH (ref 0–149)
VLDL Cholesterol Cal: 52 mg/dL — ABNORMAL HIGH (ref 5–40)

## 2023-10-28 ENCOUNTER — Encounter: Payer: Self-pay | Admitting: Family Medicine

## 2023-10-28 DIAGNOSIS — M7581 Other shoulder lesions, right shoulder: Secondary | ICD-10-CM

## 2023-10-29 DIAGNOSIS — H524 Presbyopia: Secondary | ICD-10-CM | POA: Diagnosis not present

## 2023-10-29 DIAGNOSIS — H40053 Ocular hypertension, bilateral: Secondary | ICD-10-CM | POA: Diagnosis not present

## 2023-11-13 ENCOUNTER — Other Ambulatory Visit: Payer: Self-pay | Admitting: Family Medicine

## 2023-11-13 DIAGNOSIS — Z1231 Encounter for screening mammogram for malignant neoplasm of breast: Secondary | ICD-10-CM

## 2023-11-26 ENCOUNTER — Ambulatory Visit
Admission: RE | Admit: 2023-11-26 | Discharge: 2023-11-26 | Disposition: A | Discharge status: Home / Self Care | Payer: PPO | Source: Ambulatory Visit | Attending: Family Medicine

## 2023-11-26 DIAGNOSIS — Z1231 Encounter for screening mammogram for malignant neoplasm of breast: Secondary | ICD-10-CM

## 2023-11-28 DIAGNOSIS — M25511 Pain in right shoulder: Secondary | ICD-10-CM | POA: Diagnosis not present

## 2023-12-05 DIAGNOSIS — S90121A Contusion of right lesser toe(s) without damage to nail, initial encounter: Secondary | ICD-10-CM | POA: Diagnosis not present

## 2023-12-05 DIAGNOSIS — L570 Actinic keratosis: Secondary | ICD-10-CM | POA: Diagnosis not present

## 2023-12-05 DIAGNOSIS — Z7189 Other specified counseling: Secondary | ICD-10-CM | POA: Diagnosis not present

## 2023-12-05 DIAGNOSIS — L814 Other melanin hyperpigmentation: Secondary | ICD-10-CM | POA: Diagnosis not present

## 2023-12-05 DIAGNOSIS — D22112 Melanocytic nevi of right lower eyelid, including canthus: Secondary | ICD-10-CM | POA: Diagnosis not present

## 2023-12-05 DIAGNOSIS — D225 Melanocytic nevi of trunk: Secondary | ICD-10-CM | POA: Diagnosis not present

## 2023-12-05 DIAGNOSIS — L820 Inflamed seborrheic keratosis: Secondary | ICD-10-CM | POA: Diagnosis not present

## 2023-12-05 DIAGNOSIS — L821 Other seborrheic keratosis: Secondary | ICD-10-CM | POA: Diagnosis not present

## 2023-12-05 DIAGNOSIS — D485 Neoplasm of uncertain behavior of skin: Secondary | ICD-10-CM | POA: Diagnosis not present

## 2024-01-02 ENCOUNTER — Ambulatory Visit: Payer: PPO

## 2024-01-02 VITALS — Ht 65.0 in | Wt 165.0 lb

## 2024-01-02 DIAGNOSIS — Z0000 Encounter for general adult medical examination without abnormal findings: Secondary | ICD-10-CM

## 2024-01-02 NOTE — Progress Notes (Signed)
Subjective:   Teresa Patel is a 73 y.o. female who presents for Medicare Annual (Subsequent) preventive examination.  Visit Complete: Virtual I connected with  Teresa Patel on 01/02/24 by a audio enabled telemedicine application and verified that I am speaking with the correct person using two identifiers.  Patient Location: Home  Provider Location: Home Office  This patient declined Interactive audio and video telecommunications. Therefore the visit was completed with audio only.  I discussed the limitations of evaluation and management by telemedicine. The patient expressed understanding and agreed to proceed.  Vital Signs: Because this visit was a virtual/telehealth visit, some criteria may be missing or patient reported. Any vitals not documented were not able to be obtained and vitals that have been documented are patient reported.  Cardiac Risk Factors include: advanced age (>75men, >37 women);dyslipidemia     Objective:    Today's Vitals   01/02/24 1342  Weight: 165 lb (74.8 kg)  Height: 5\' 5"  (1.651 m)   Body mass index is 27.46 kg/m.     01/02/2024    1:45 PM 12/13/2022    9:12 AM 11/25/2021    9:02 AM 11/24/2020    9:07 AM 10/23/2019    9:05 AM 10/15/2017    8:36 AM 03/21/2017    9:27 AM  Advanced Directives  Does Patient Have a Medical Advance Directive? No Yes Yes Yes Yes Yes Yes  Type of Advance Directive  Living will;Healthcare Power of [employer] Power of [place];Out of facility DNR (pink MOST or yellow form);Living will Healthcare Power of [place];Living will Healthcare Power of [place];Living will Healthcare Power of [place];Living will Healthcare Power of [place];Living will  Does patient want to make changes to medical advance directive?  No - Patient declined No - Patient declined  No - Patient declined No - Patient declined   Copy of Healthcare Power of Attorney in Chart?  No - copy requested No - copy requested No - copy requested No - copy  requested No - copy requested No - copy requested  Would patient like information on creating a medical advance directive? Yes (MAU/Ambulatory/Procedural Areas - Information given)          Current Medications (verified) Outpatient Encounter Medications as of 01/02/2024  Medication Sig   atorvastatin (LIPITOR) 40 MG tablet Take 1 tablet (40 mg total) by mouth daily at 6 PM.   CALCIUM-VITAMIN D PO Take 120 mg by mouth daily.   Coenzyme Q10 100 MG TABS Take 100 mg by mouth daily.   ibuprofen (ADVIL,MOTRIN) 200 MG tablet Take 200 mg by mouth as needed for moderate pain.    lisinopril (ZESTRIL) 20 MG tablet Take 1 tablet (20 mg total) by mouth daily.   loratadine (CLARITIN) 10 MG tablet Take 10 mg by mouth daily as needed for allergies.   Magnesium 200 MG TABS Take 200 mg by mouth daily.   Omega-3 Fatty Acids (FISH OIL PO) Take 2,400 mg by mouth daily.   omeprazole (PRILOSEC) 20 MG capsule Take 20 mg by mouth daily as needed.   Probiotic Product (PROBIOTIC-10 PO) Take by mouth daily.   Multiple Vitamins-Minerals (ONE-A-DAY 50 PLUS PO) Take by mouth. (Patient not taking: Reported on 01/02/2024)   No facility-administered encounter medications on file as of 01/02/2024.    Allergies (verified) Patient has no known allergies.   History: Past Medical History:  Diagnosis Date   Anxiety    GERD (gastroesophageal reflux disease)    Hyperlipidemia    Hypertension  Shingles    Past Surgical History:  Procedure Laterality Date   ABDOMINAL HYSTERECTOMY     carpal tunnel Bilateral    COLONOSCOPY N/A 03/21/2017   Procedure: COLONOSCOPY;  Surgeon: Malissa Hippo, MD;  [facility];  Service: Endoscopy;  Laterality: N/A;  1030   POLYPECTOMY  03/21/2017   Procedure: POLYPECTOMY;  Surgeon: Malissa Hippo, MD;  Location: [facility];  Service: Endoscopy;;  colon   Family History  Problem Relation Age of Onset   Heart disease Mother    Heart disease Father    Cancer Father         prostate   Stroke Sister    Melanoma Daughter 45   Cancer Maternal Grandmother        unknown   Stroke Paternal Grandfather    Cancer Brother        pancreatic   Heart disease Brother        heart attack   Heart disease Brother    Diabetes Brother    Kidney disease Brother    Cancer Brother        prostate   Colon cancer Neg Hx    Breast cancer Neg Hx    Social History   Socioeconomic History   Marital status: Married    Spouse name: Remi Deter   Number of children: 3   Years of education: 12   Highest education level: High school graduate  Occupational History   Occupation: Retired    Comment: Company secretary  Tobacco Use   Smoking status: Never    Passive exposure: Yes   Smokeless tobacco: Never  Vaping Use   Vaping status: Never Used  Substance and Sexual Activity   Alcohol use: No   Drug use: No   Sexual activity: Yes  Other Topics Concern   Not on file  Social History Narrative   ** Merged History Encounter **       Social Drivers of Health   Financial Resource Strain: Low Risk  (01/02/2024)   Overall Financial Resource Strain (CARDIA)    Difficulty of Paying Living Expenses: Not hard at all  Food Insecurity: No Food Insecurity (01/02/2024)   Hunger Vital Sign    Worried About Running Out of Food in the Last Year: Never true    Ran Out of Food in the Last Year: Never true  Transportation Needs: No Transportation Needs (01/02/2024)   PRAPARE - Administrator, Civil Service (Medical): No    Lack of Transportation (Non-Medical): No  Physical Activity: Sufficiently Active (01/02/2024)   Exercise Vital Sign    Days of Exercise per Week: 5 days    Minutes of Exercise per Session: 30 min  Stress: No Stress Concern Present (01/02/2024)   [employer] of Occupational Health - Occupational Stress Questionnaire    Feeling of Stress : Not at all  Social Connections: Socially Integrated (01/02/2024)   Social Connection and Isolation Panel [NHANES]     Frequency of Communication with Friends and Family: More than three times a week    Frequency of Social Gatherings with Friends and Family: Three times a week    Attends Religious Services: More than 4 times per year    Active Member of Clubs or Organizations: Yes    Attends Banker Meetings: More than 4 times per year    Marital Status: Married    Tobacco Counseling Counseling given: Not Answered   Clinical Intake:  Pre-visit preparation completed: Yes  Pain : No/denies pain     Diabetes: No  How often do you need to have someone help you when you read instructions, pamphlets, or other written materials from your doctor or pharmacy?: 1 - Never  Interpreter Needed?: No  Information entered by :: Kandis Fantasia LPN   Activities of Daily Living    01/02/2024    1:45 PM  In your present state of health, do you have any difficulty performing the following activities:  Hearing? 0  Vision? 0  Difficulty concentrating or making decisions? 0  Walking or climbing stairs? 0  Dressing or bathing? 0  Doing errands, shopping? 0  Preparing Food and eating ? N  Using the Toilet? N  In the past six months, have you accidently leaked urine? N  Do you have problems with loss of bowel control? N  Managing your Medications? N  Managing your Finances? N  Housekeeping or managing your Housekeeping? N    Patient Care Team: Dettinger, Elige Radon, MD as PCP - General (Family Medicine) Malissa Hippo, MD (Inactive) as Consulting Physician (Gastroenterology) Ernestina Penna, MD as Referring Physician (Obstetrics and Gynecology) Dr [facility], OD (Optometry)  Indicate any recent Medical Services you may have received from other than [facility] providers in the past year (date may be approximate).     Assessment:   This is a routine wellness examination for Teresa Patel.  Hearing/Vision screen Hearing Screening - Comments:: Denies hearing difficulties   Vision  Screening - Comments:: Wears rx glasses - up to date with routine eye exams with Dr. Desiree Lucy     Goals Addressed             This Visit's Progress    COMPLETED: awv       11/24/2020 AWV Goal: Fall Prevention  Over the next year, patient will decrease their risk for falls by: Using assistive devices, such as a cane or walker, as needed Identifying fall risks within their home and correcting them by: Removing throw rugs Adding handrails to stairs or ramps Removing clutter and keeping a clear pathway throughout the home Increasing light, especially at night Adding shower handles/bars Raising toilet seat Identifying potential personal risk factors for falls: Medication side effects Incontinence/urgency Vestibular dysfunction Hearing loss Musculoskeletal disorders Neurological disorders Orthostatic hypotension  .awv     COMPLETED: DIET - INCREASE WATER INTAKE        Depression Screen    01/02/2024    1:44 PM 10/26/2023    2:25 PM 02/09/2023   11:02 AM 12/13/2022    9:10 AM 08/30/2022    8:29 AM 11/25/2021    8:35 AM 08/30/2021    8:15 AM  PHQ 2/9 Scores  PHQ - 2 Score 0 0 0 0 0 0 0  PHQ- 9 Score  0 0        Fall Risk    01/02/2024    1:44 PM 10/26/2023    2:26 PM 02/09/2023   11:02 AM 12/13/2022    9:08 AM 08/30/2022    8:29 AM  Fall Risk   Falls in the past year? 0 0 0 0 0  Number falls in past yr: 0 0  0   Injury with Fall? 0 0  0   Risk for fall due to : No Fall Risks No Fall Risks  No Fall Risks   Follow up Falls prevention discussed;Education provided;Falls evaluation completed Falls evaluation completed  Falls evaluation completed;Education provided;Falls prevention discussed  MEDICARE RISK AT HOME: Medicare Risk at Home Any stairs in or around the home?: No If so, are there any without handrails?: No Home free of loose throw rugs in walkways, pet beds, electrical cords, etc?: Yes Adequate lighting in your home to reduce risk of falls?: Yes Life  alert?: No Use of a cane, walker or w/c?: No Grab bars in the bathroom?: Yes Shower chair or bench in shower?: No Elevated toilet seat or a handicapped toilet?: Yes  TIMED UP AND GO:  Was the test performed?  No    Cognitive Function:    10/15/2017    8:48 AM 03/13/2016    9:21 AM  MMSE - Mini Mental State Exam  Orientation to time 5 5  Orientation to Place 5 5  Registration 3 3  Attention/ Calculation 5 5  Recall 3 3  Language- name 2 objects 2 2  Language- repeat 1 1  Language- follow 3 step command 3 3  Language- read & follow direction 1 1  Write a sentence 1 1  Copy design 1 1  Total score 30 30        01/02/2024    1:45 PM 12/13/2022    9:12 AM 11/25/2021    9:05 AM 11/25/2021    8:41 AM 11/24/2020    9:10 AM  6CIT Screen  What Year? 0 points 0 points 0 points 0 points 0 points  What month? 0 points 0 points 0 points 0 points 0 points  What time? 0 points 0 points 0 points 0 points 0 points  Count back from 20 0 points 0 points 0 points 0 points 0 points  Months in reverse 0 points 0 points 0 points  0 points  Repeat phrase 0 points 0 points 0 points  0 points  Total Score 0 points 0 points 0 points  0 points    Immunizations Immunization History  Administered Date(s) Administered   Fluad Quad(high Dose 65+) 11/26/2020, 10/25/2021   Fluad Trivalent(High Dose 65+) 10/26/2023   Influenza Whole 10/25/2012   Influenza, High Dose Seasonal PF 10/06/2016, 10/15/2017, 10/21/2018, 11/08/2019   Influenza,inj,Quad PF,6+ Mos 11/28/2013, 10/27/2014, 10/19/2015   Influenza-Unspecified 11/20/2022   Moderna SARS-COV2 Booster Vaccination 12/07/2020   Moderna Sars-Covid-2 Vaccination 01/09/2020, 02/09/2020   Pneumococcal Conjugate-13 07/14/2014, 04/11/2017   Pneumococcal Polysaccharide-23 07/29/2015   Td 01/27/2008   Tdap 11/08/2019    TDAP status: Up to date  Flu Vaccine status: Up to date  Pneumococcal vaccine status: Up to date  Covid-19 vaccine status:  Information provided on how to obtain vaccines.   Qualifies for Shingles Vaccine? Yes   Zostavax completed No   Shingrix Completed?: No.    Education has been provided regarding the importance of this vaccine. Patient has been advised to call insurance company to determine out of pocket expense if they have not yet received this vaccine. Advised may also receive vaccine at local pharmacy or Health Dept. Verbalized acceptance and understanding.  Screening Tests Health Maintenance  Topic Date Due   COVID-19 Vaccine (4 - 2024-25 season) 08/19/2023   Zoster Vaccines- Shingrix (1 of 2) 01/26/2024 (Originally [birth year]1999)   Medicare Annual Wellness (AWV)  01/01/2025   DEXA SCAN  08/31/2025   MAMMOGRAM  11/25/2025   Colonoscopy  03/22/2027   DTaP/Tdap/Td (3 - Td or Tdap) 11/07/2029   Pneumonia Vaccine 18+ Years old  Completed   INFLUENZA VACCINE  Completed   Hepatitis C Screening  Completed   HPV VACCINES  Aged Out  Health Maintenance  Health Maintenance Due  Topic Date Due   COVID-19 Vaccine (4 - 2024-25 season) 08/19/2023    Colorectal cancer screening: Type of screening: Colonoscopy. Completed 03/21/17. Repeat every 10 years  Mammogram status: Completed 11/26/23. Repeat every year  Bone Density status: Completed 08/31/22. Results reflect: Bone density results: OSTEOPENIA. Repeat every 2 years.  Lung Cancer Screening: (Low Dose CT Chest recommended if Age 71-80 years, 20 pack-year currently smoking OR have quit w/in 15years.) does not qualify.   Lung Cancer Screening Referral: n/a  Additional Screening:  Hepatitis C Screening: does qualify; Completed 04/11/17  Vision Screening: Recommended annual ophthalmology exams for early detection of glaucoma and other disorders of the eye. Is the patient up to date with their annual eye exam?  Yes  Who is the provider or what is the name of the office in which the patient attends annual eye exams? Dr. Desiree Lucy If pt is not established  with a provider, would they like to be referred to a provider to establish care? No .   Dental Screening: Recommended annual dental exams for proper oral hygiene  Community Resource Referral / Chronic Care Management: CRR required this visit?  No   CCM required this visit?  No     Plan:     I have personally reviewed and noted the following in the patient's chart:   Medical and social history Use of alcohol, tobacco or illicit drugs  Current medications and supplements including opioid prescriptions. Patient is not currently taking opioid prescriptions. Functional ability and status Nutritional status Physical activity Advanced directives List of other physicians Hospitalizations, surgeries, and ER visits in previous 12 months Vitals Screenings to include cognitive, depression, and falls Referrals and appointments  In addition, I have reviewed and discussed with patient certain preventive protocols, quality metrics, and best practice recommendations. A written personalized care plan for preventive services as well as general preventive health recommendations were provided to patient.     Kandis Fantasia [place], [place]   1/61/0960   After Visit Summary: (MyChart) Due to this being a telephonic visit, the after visit summary with patients personalized plan was offered to patient via MyChart   Nurse Notes: No concerns at this time

## 2024-01-02 NOTE — Patient Instructions (Signed)
 Teresa Patel , Thank you for taking time to come for your Medicare Wellness Visit. I appreciate your ongoing commitment to your health goals. Please review the following plan we discussed and let me know if I can assist you in the future.   Referrals/Orders/Follow-Ups/Clinician Recommendations: Aim for 30 minutes of exercise or brisk walking, 6-8 glasses of water , and 5 servings of fruits and vegetables each day.  This is a list of the screening recommended for you and due dates:  Health Maintenance  Topic Date Due   COVID-19 Vaccine (4 - 2024-25 season) 08/19/2023   Zoster (Shingles) Vaccine (1 of 2) 01/26/2024*   Medicare Annual Wellness Visit  01/01/2025   DEXA scan (bone density measurement)  08/31/2025   Mammogram  11/25/2025   Colon Cancer Screening  03/22/2027   DTaP/Tdap/Td vaccine (3 - Td or Tdap) 11/07/2029   Pneumonia Vaccine  Completed   Flu Shot  Completed   Hepatitis C Screening  Completed   HPV Vaccine  Aged Out  *Topic was postponed. The date shown is not the original due date.    Advanced directives: (ACP Link)Information on Advanced Care Planning can be found at   Secretary of [facility] Advance Health Care Directives Advance Health Care Directives ([email or web address]   Next Medicare Annual Wellness Visit scheduled for next year: Yes

## 2024-01-03 DIAGNOSIS — M6281 Muscle weakness (generalized): Secondary | ICD-10-CM | POA: Diagnosis not present

## 2024-01-03 DIAGNOSIS — M25511 Pain in right shoulder: Secondary | ICD-10-CM | POA: Diagnosis not present

## 2024-03-07 ENCOUNTER — Encounter (INDEPENDENT_AMBULATORY_CARE_PROVIDER_SITE_OTHER): Payer: Self-pay | Admitting: *Deleted

## 2024-03-20 DIAGNOSIS — N8111 Cystocele, midline: Secondary | ICD-10-CM | POA: Diagnosis not present

## 2024-03-20 DIAGNOSIS — Z4689 Encounter for fitting and adjustment of other specified devices: Secondary | ICD-10-CM | POA: Diagnosis not present

## 2024-04-09 DIAGNOSIS — L820 Inflamed seborrheic keratosis: Secondary | ICD-10-CM | POA: Diagnosis not present

## 2024-04-09 DIAGNOSIS — R208 Other disturbances of skin sensation: Secondary | ICD-10-CM | POA: Diagnosis not present

## 2024-04-09 DIAGNOSIS — L2989 Other pruritus: Secondary | ICD-10-CM | POA: Diagnosis not present

## 2024-04-09 DIAGNOSIS — L538 Other specified erythematous conditions: Secondary | ICD-10-CM | POA: Diagnosis not present

## 2024-04-09 DIAGNOSIS — L905 Scar conditions and fibrosis of skin: Secondary | ICD-10-CM | POA: Diagnosis not present

## 2024-07-15 ENCOUNTER — Telehealth: Payer: Self-pay

## 2024-07-15 NOTE — Telephone Encounter (Signed)
 Any room Thanks

## 2024-07-15 NOTE — Telephone Encounter (Signed)
 Who is your primary care physician: Fonda Dettinger   Reasons for the colonoscopy: Hx polyps  Have you had a colonoscopy before?  Yes 03/31/2017 Dr.Rehman  Do you have family history of colon cancer? no  Previous colonoscopy with polyps removed? no  Do you have a history colorectal cancer?   no  Are you diabetic? If yes, Type 1 or Type 2?    no  Do you have a prosthetic or mechanical heart valve? no  Do you have a pacemaker/defibrillator?   no  Have you had endocarditis/atrial fibrillation? no  Have you had joint replacement within the last 12 months?  no  Do you tend to be constipated or have to use laxatives? no  Do you have any history of drugs or alchohol?  no  Do you use supplemental oxygen?  no  Have you had a stroke or heart attack within the last 6 months? no  Do you take weight loss medication?  no  For female patients: have you had a hysterectomy?  Yes                                      are you post menopausal?       yes                                            do you still have your menstrual cycle? no      Do you take any blood-thinning medications such as: (aspirin, warfarin, Plavix, Aggrenox)  no  If yes we need the name, milligram, dosage and who is prescribing doctor  Current Outpatient Medications on File Prior to Visit  Medication Sig Dispense Refill   CALCIUM -VITAMIN D  PO Take 120 mg by mouth daily.     Coenzyme Q10 100 MG TABS Take 100 mg by mouth daily.     lisinopril  (ZESTRIL ) 20 MG tablet Take 1 tablet (20 mg total) by mouth daily. 90 tablet 3   Probiotic Product (PROBIOTIC-10 PO) Take by mouth daily.     No current facility-administered medications on file prior to visit.    No Known Allergies   Pharmacy: [facility] [place]  Primary Insurance Name: Cher Hails [id number]  Best number where you can be reached: [phone]

## 2024-07-16 NOTE — Telephone Encounter (Signed)
 Called # on form and received message the number currently not in service. Mailed letter to call.

## 2024-09-04 MED ORDER — CLENPIQ 10-3.5-12 MG-GM -GM/175ML PO SOLN: 1.0000 | ORAL | 0 refills | Status: DC

## 2024-09-04 NOTE — Addendum Note (Signed)
 Addended by: JEANELL GRAEME RAMAN on: 09/04/2024 02:58 PM   Modules accepted: Orders

## 2024-09-04 NOTE — Telephone Encounter (Signed)
 Pt called in. She has been scheduled for 10/1. She requested a lower volume prep that she does not have to drink as much. Advised will send in and to check with her pharmacy regarding cost. Also aware will mail instructions.

## 2024-09-04 NOTE — Telephone Encounter (Signed)
 Questionnaire from recall, no referral needed

## 2024-09-17 ENCOUNTER — Encounter (INDEPENDENT_AMBULATORY_CARE_PROVIDER_SITE_OTHER): Payer: Self-pay | Admitting: *Deleted

## 2024-09-17 ENCOUNTER — Ambulatory Visit (HOSPITAL_COMMUNITY): Admitting: Anesthesiology

## 2024-09-17 ENCOUNTER — Ambulatory Visit (HOSPITAL_COMMUNITY)
Admission: RE | Admit: 2024-09-17 | Discharge: 2024-09-17 | Disposition: A | Discharge status: Home / Self Care | Attending: Gastroenterology | Admitting: Gastroenterology

## 2024-09-17 ENCOUNTER — Other Ambulatory Visit: Payer: Self-pay

## 2024-09-17 ENCOUNTER — Encounter (HOSPITAL_COMMUNITY)
Admission: RE | Disposition: A | Discharge status: Home / Self Care | Payer: Self-pay | Source: Home / Self Care | Attending: Gastroenterology

## 2024-09-17 ENCOUNTER — Encounter (HOSPITAL_COMMUNITY): Payer: Self-pay | Admitting: Gastroenterology

## 2024-09-17 DIAGNOSIS — Z860101 Personal history of adenomatous and serrated colon polyps: Secondary | ICD-10-CM

## 2024-09-17 DIAGNOSIS — K648 Other hemorrhoids: Secondary | ICD-10-CM | POA: Insufficient documentation

## 2024-09-17 DIAGNOSIS — F419 Anxiety disorder, unspecified: Secondary | ICD-10-CM

## 2024-09-17 DIAGNOSIS — I10 Essential (primary) hypertension: Secondary | ICD-10-CM | POA: Diagnosis not present

## 2024-09-17 DIAGNOSIS — K644 Residual hemorrhoidal skin tags: Secondary | ICD-10-CM | POA: Diagnosis not present

## 2024-09-17 DIAGNOSIS — K635 Polyp of colon: Secondary | ICD-10-CM | POA: Diagnosis not present

## 2024-09-17 DIAGNOSIS — Z1211 Encounter for screening for malignant neoplasm of colon: Secondary | ICD-10-CM | POA: Diagnosis not present

## 2024-09-17 DIAGNOSIS — Z7722 Contact with and (suspected) exposure to environmental tobacco smoke (acute) (chronic): Secondary | ICD-10-CM | POA: Diagnosis not present

## 2024-09-17 DIAGNOSIS — K5520 Angiodysplasia of colon without hemorrhage: Secondary | ICD-10-CM | POA: Insufficient documentation

## 2024-09-17 DIAGNOSIS — Z860100 Personal history of colon polyps, unspecified: Secondary | ICD-10-CM

## 2024-09-17 DIAGNOSIS — E785 Hyperlipidemia, unspecified: Secondary | ICD-10-CM | POA: Insufficient documentation

## 2024-09-17 DIAGNOSIS — K219 Gastro-esophageal reflux disease without esophagitis: Secondary | ICD-10-CM | POA: Diagnosis not present

## 2024-09-17 DIAGNOSIS — D123 Benign neoplasm of transverse colon: Secondary | ICD-10-CM | POA: Insufficient documentation

## 2024-09-17 HISTORY — PX: COLONOSCOPY: SHX5424

## 2024-09-17 LAB — HM COLONOSCOPY

## 2024-09-17 SURGERY — COLONOSCOPY
Anesthesia: General

## 2024-09-17 MED ORDER — LIDOCAINE HCL (CARDIAC) PF 100 MG/5ML IV SOSY
PREFILLED_SYRINGE | INTRAVENOUS | Status: DC | PRN
Start: 2024-09-17 — End: 2024-09-17
  Administered 2024-09-17: 100 mg via INTRAVENOUS

## 2024-09-17 MED ORDER — EPHEDRINE SULFATE (PRESSORS) 50 MG/ML IJ SOLN
INTRAMUSCULAR | Status: DC | PRN
  Administered 2024-09-17: 10 mg via INTRAVENOUS
  Administered 2024-09-17: 5 mg via INTRAVENOUS

## 2024-09-17 MED ORDER — LACTATED RINGERS IV SOLN: INTRAVENOUS | Status: DC

## 2024-09-17 MED ORDER — PHENYLEPHRINE HCL (PRESSORS) 10 MG/ML IV SOLN
INTRAVENOUS | Status: DC | PRN
Start: 2024-09-17 — End: 2024-09-17
  Administered 2024-09-17 (×2): 80 ug via INTRAVENOUS

## 2024-09-17 MED ORDER — PROPOFOL 500 MG/50ML IV EMUL
INTRAVENOUS | Status: DC | PRN
  Administered 2024-09-17: 150 ug/kg/min via INTRAVENOUS

## 2024-09-17 NOTE — Transfer of Care (Signed)
 Immediate Anesthesia Transfer of Care Note  Patient: Teresa Patel  Procedure(s) Performed: COLONOSCOPY  Patient Location: PACU  Anesthesia Type:General  Level of Consciousness: awake, alert , and oriented  Airway & Oxygen Therapy: Patient Spontanous Breathing  Post-op Assessment: Report given to RN  Post vital signs: Reviewed  Last Vitals:  Vitals Value Taken Time  BP 94/55 1026  Temp 97.1 1026  Pulse 77 1026  Resp 12 1026  SpO2 94 1026    Last Pain:  Vitals:   09/17/24 0956  TempSrc:   PainSc: 0-No pain      Patients Stated Pain Goal: 8 (09/17/24 0858)  Complications: There were no known notable events for this encounter.

## 2024-09-17 NOTE — Anesthesia Preprocedure Evaluation (Signed)
 Anesthesia Evaluation  Patient identified by MRN, date of birth, ID band Patient awake    Reviewed: Allergy & Precautions, H&P , NPO status , Patient's Chart, lab work & pertinent test results, reviewed documented beta blocker date and time   Airway Mallampati: II  TM Distance: >3 FB Neck ROM: full    Dental no notable dental hx. (+) Dental Advisory Given, Teeth Intact   Pulmonary neg pulmonary ROS   Pulmonary exam normal breath sounds clear to auscultation       Cardiovascular hypertension, Normal cardiovascular exam Rhythm:regular Rate:Normal     Neuro/Psych   Anxiety     negative neurological ROS  negative psych ROS   GI/Hepatic Neg liver ROS,GERD  ,,  Endo/Other  negative endocrine ROS    Renal/GU negative Renal ROS  negative genitourinary   Musculoskeletal   Abdominal   Peds  Hematology negative hematology ROS (+)   Anesthesia Other Findings   Reproductive/Obstetrics negative OB ROS                              Anesthesia Physical Anesthesia Plan  ASA: 2  Anesthesia Plan: General   Post-op Pain Management: Minimal or no pain anticipated   Induction:   PONV Risk Score and Plan: Propofol infusion  Airway Management Planned: Natural Airway and Nasal Cannula  Additional Equipment: None  Intra-op Plan:   Post-operative Plan:   Informed Consent: I have reviewed the patients History and Physical, chart, labs and discussed the procedure including the risks, benefits and alternatives for the proposed anesthesia with the patient or authorized representative who has indicated his/her understanding and acceptance.     Dental Advisory Given  Plan Discussed with: CRNA  Anesthesia Plan Comments:          Anesthesia Quick Evaluation

## 2024-09-17 NOTE — Op Note (Signed)
 [facility] Patient Name: Teresa Patel Procedure Date: 09/17/2024 9:50 AM MRN: [id number] Date of Birth: 03/13/1951 Attending MD: Toribio Fortune , , [id number] CSN: [id number] Age: 73 Admit Type: Outpatient Procedure:                Colonoscopy Indications:              Surveillance: Personal history of adenomatous                            polyps on last colonoscopy > 5 years ago Providers:                Toribio Fortune, Crystal Page, Alm Dorcas Balm., Technician Referring MD:              Medicines:                Monitored Anesthesia Care Complications:            No immediate complications. Estimated Blood Loss:     Estimated blood loss: none. Procedure:                Pre-Anesthesia Assessment:                           - Prior to the procedure, a History and Physical                            was performed, and patient medications, allergies                            and sensitivities were reviewed. The patient's                            tolerance of previous anesthesia was reviewed.                           - The risks and benefits of the procedure and the                            sedation options and risks were discussed with the                            patient. All questions were answered and informed                            consent was obtained.                           - ASA Grade Assessment: II - A patient with mild                            systemic disease.                           After obtaining informed consent, the colonoscope  was passed under direct vision. Throughout the                            procedure, the patient's blood pressure, pulse, and                            oxygen saturations were monitored continuously. The                            [id number]-[id number] ([id number]) Peds Colon was introduced                            through the anus and advanced to the the cecum,                             identified by appendiceal orifice and ileocecal                            valve. The colonoscopy was performed without                            difficulty. The patient tolerated the procedure                            well. The quality of the bowel preparation was good. Scope In: 10:02:54 AM Scope Out: 10:21:55 AM Scope Withdrawal Time: 0 hours 15 minutes 23 seconds  Total Procedure Duration: 0 hours 19 minutes 1 second  Findings:      Hemorrhoids were found on perianal exam.      A single small angiodysplastic lesion without bleeding was found in the       ascending colon.      Two sessile polyps were found in the transverse colon. The polyps were 2       mm in size. These polyps were removed with a cold snare. Resection and       retrieval were complete.      Non-bleeding external and internal hemorrhoids were found during       retroflexion and during perianal exam. The hemorrhoids were medium-sized. Impression:               - Hemorrhoids found on perianal exam.                           - A single non-bleeding colonic angiodysplastic                            lesion.                           - Two 2 mm polyps in the transverse colon, removed                            with a cold snare. Resected and retrieved.                           - Non-bleeding external and internal hemorrhoids.  Moderate Sedation:      Per Anesthesia Care Recommendation:           - Discharge patient to home (ambulatory).                           - Resume previous diet.                           - Await pathology results.                           - Repeat colonoscopy for surveillance based on                            pathology results. Procedure Code(s):        --- Professional ---                           [phone], Colonoscopy, flexible; with removal of                            tumor(s), polyp(s), or other lesion(s) by snare                            technique Diagnosis  Code(s):        --- Professional ---                           Z86.010, Personal history of colonic polyps                           K55.20, Angiodysplasia of colon without hemorrhage                           D12.3, Benign neoplasm of transverse colon (hepatic                            flexure or splenic flexure)                           K64.8, Other hemorrhoids CPT copyright 2022 [employer]. All rights reserved. The codes documented in this report are preliminary and upon coder review may  be revised to meet current compliance requirements. Toribio Fortune, MD Toribio Fortune,  09/17/2024 10:27:45 AM This report has been signed electronically. Number of Addenda: 0

## 2024-09-17 NOTE — Discharge Instructions (Signed)
 You are being discharged to home.  Resume your previous diet.  We are waiting for your pathology results.  Your physician has recommended a repeat colonoscopy for surveillance based on pathology results.

## 2024-09-17 NOTE — H&P (Signed)
 Teresa Patel is an 73 y.o. female.   Chief Complaint: History of colon polyps HPI: 73 year old female with past medical history of GERD, anxiety, hyperlipidemia, hypertension, coming for history of colon polyps.  Colonoscopy was performed in 2018, had 2 tubular adenomas removed.  The patient denies having any complaints such as melena, hematochezia, abdominal pain or distention, change in her bowel movement consistency or frequency, no changes in weight recently.  No family history of colorectal cancer.   Past Medical History:  Diagnosis Date   Anxiety    GERD (gastroesophageal reflux disease)    Hyperlipidemia    Hypertension    Shingles     Past Surgical History:  Procedure Laterality Date   ABDOMINAL HYSTERECTOMY     carpal tunnel Bilateral    COLONOSCOPY N/A 03/21/2017   Procedure: COLONOSCOPY;  Surgeon: Claudis RAYMOND Rivet, MD;  Location: [facility];  Service: Endoscopy;  Laterality: N/A;  1030   POLYPECTOMY  03/21/2017   Procedure: POLYPECTOMY;  Surgeon: Claudis RAYMOND Rivet, MD;  Location: [facility];  Service: Endoscopy;;  colon    Family History  Problem Relation Age of Onset   Heart disease Mother    Heart disease Father    Cancer Father        prostate   Stroke Sister    Melanoma Daughter 34   Cancer Maternal Grandmother        unknown   Stroke Paternal Grandfather    Cancer Brother        pancreatic   Heart disease Brother        heart attack   Heart disease Brother    Diabetes Brother    Kidney disease Brother    Cancer Brother        prostate   Colon cancer Neg Hx    Breast cancer Neg Hx    Social History:  reports that she has never smoked. She has been exposed to tobacco smoke. She has never used smokeless tobacco. She reports that she does not drink alcohol and does not use drugs.  Allergies: No Known Allergies  Medications Prior to Admission  Medication Sig Dispense Refill   atorvastatin  (LIPITOR) 40 MG tablet Take 40 mg by mouth daily.     lisinopril   (ZESTRIL ) 20 MG tablet Take 1 tablet (20 mg total) by mouth daily. 90 tablet 3   Sod Picosulfate-Mag Ox-Cit Acd (CLENPIQ ) 10-3.5-12 MG-GM -GM/175ML SOLN Take 1 kit by mouth as directed. 350 mL 0   CALCIUM -VITAMIN D  PO Take 120 mg by mouth daily.     Coenzyme Q10 100 MG TABS Take 100 mg by mouth daily.     Probiotic Product (PROBIOTIC-10 PO) Take by mouth daily.      No results found for this or any previous visit (from the past 48 hours). No results found.  Review of Systems  All other systems reviewed and are negative.   Blood pressure (!) 147/74, pulse 77, temperature 98.6 F (37 C), temperature source Oral, resp. rate 16, height 5' 5 (1.651 m), weight 75.8 kg, SpO2 (!) 22%. Physical Exam  GENERAL: The patient is AO x3, in no acute distress. HEENT: Head is normocephalic and atraumatic. EOMI are intact. Mouth is well hydrated and without lesions. NECK: Supple. No masses LUNGS: Clear to auscultation. No presence of rhonchi/wheezing/rales. Adequate chest expansion HEART: RRR, normal s1 and s2. ABDOMEN: Soft, nontender, no guarding, no peritoneal signs, and nondistended. BS +. No masses. EXTREMITIES: Without any cyanosis, clubbing, rash, lesions  or edema. NEUROLOGIC: AOx3, no focal motor deficit. SKIN: no jaundice, no rashes  Assessment/Plan 73 year old female with past medical history of GERD, anxiety, hyperlipidemia, hypertension, coming for history of colon polyps.  Will proceed with colonoscopy.  Toribio Eartha Flavors, MD 09/17/2024, 9:41 AM

## 2024-09-17 NOTE — Anesthesia Postprocedure Evaluation (Signed)
 Anesthesia Post Note  Patient: Teresa Patel  Procedure(s) Performed: COLONOSCOPY  Patient location during evaluation: Phase II Anesthesia Type: General Level of consciousness: awake and alert Pain management: pain level controlled Vital Signs Assessment: post-procedure vital signs reviewed and stable Respiratory status: spontaneous breathing, nonlabored ventilation and respiratory function stable Cardiovascular status: stable Anesthetic complications: no   There were no known notable events for this encounter.   Last Vitals:  Vitals:   09/17/24 1026 09/17/24 1029  BP: (!) 94/45 (!) 108/58  Pulse: 76   Resp: 20   Temp: 36.7 C   SpO2: 100%     Last Pain:  Vitals:   09/17/24 1029  TempSrc:   PainSc: 0-No pain                 Kayle Passarelli L Duanna Runk

## 2024-09-18 ENCOUNTER — Ambulatory Visit (INDEPENDENT_AMBULATORY_CARE_PROVIDER_SITE_OTHER): Payer: Self-pay | Admitting: Gastroenterology

## 2024-09-18 ENCOUNTER — Encounter (HOSPITAL_COMMUNITY): Payer: Self-pay | Admitting: Gastroenterology

## 2024-09-18 LAB — SURGICAL PATHOLOGY

## 2024-09-18 NOTE — Progress Notes (Signed)
 5 yr TCS noted in recall Patient result letter mailed Patient's PCP is on EPIC

## 2024-10-01 ENCOUNTER — Encounter (INDEPENDENT_AMBULATORY_CARE_PROVIDER_SITE_OTHER): Payer: Self-pay | Admitting: Gastroenterology

## 2024-10-28 DIAGNOSIS — H524 Presbyopia: Secondary | ICD-10-CM | POA: Diagnosis not present

## 2024-10-28 DIAGNOSIS — H40012 Open angle with borderline findings, low risk, left eye: Secondary | ICD-10-CM | POA: Diagnosis not present

## 2024-10-30 ENCOUNTER — Other Ambulatory Visit: Payer: Self-pay | Admitting: Family Medicine

## 2024-10-30 ENCOUNTER — Other Ambulatory Visit: Payer: Self-pay

## 2024-10-30 ENCOUNTER — Ambulatory Visit: Payer: PPO | Admitting: Family Medicine

## 2024-10-30 ENCOUNTER — Encounter: Payer: Self-pay | Admitting: Family Medicine

## 2024-10-30 VITALS — BP 135/74 | HR 68 | Ht 65.0 in | Wt 167.0 lb

## 2024-10-30 DIAGNOSIS — Z780 Asymptomatic menopausal state: Secondary | ICD-10-CM

## 2024-10-30 DIAGNOSIS — I10 Essential (primary) hypertension: Secondary | ICD-10-CM

## 2024-10-30 DIAGNOSIS — E782 Mixed hyperlipidemia: Secondary | ICD-10-CM

## 2024-10-30 DIAGNOSIS — K219 Gastro-esophageal reflux disease without esophagitis: Secondary | ICD-10-CM | POA: Diagnosis not present

## 2024-10-30 DIAGNOSIS — Z23 Encounter for immunization: Secondary | ICD-10-CM | POA: Diagnosis not present

## 2024-10-30 DIAGNOSIS — Z0001 Encounter for general adult medical examination with abnormal findings: Secondary | ICD-10-CM

## 2024-10-30 DIAGNOSIS — Z13820 Encounter for screening for osteoporosis: Secondary | ICD-10-CM

## 2024-10-30 DIAGNOSIS — Z0000 Encounter for general adult medical examination without abnormal findings: Secondary | ICD-10-CM

## 2024-10-30 LAB — CBC WITH DIFFERENTIAL/PLATELET
Basophils Absolute: 0.1 x10E3/uL (ref 0.0–0.2)
Basos: 1 %
EOS (ABSOLUTE): 0.2 x10E3/uL (ref 0.0–0.4)
Eos: 3 %
Hematocrit: 43.4 % (ref 34.0–46.6)
Hemoglobin: 14.1 g/dL (ref 11.1–15.9)
Immature Grans (Abs): 0 x10E3/uL (ref 0.0–0.1)
Immature Granulocytes: 0 %
Lymphocytes Absolute: 2.6 x10E3/uL (ref 0.7–3.1)
Lymphs: 42 %
MCH: 29.9 pg (ref 26.6–33.0)
MCHC: 32.5 g/dL (ref 31.5–35.7)
MCV: 92 fL (ref 79–97)
Monocytes Absolute: 0.7 x10E3/uL (ref 0.1–0.9)
Monocytes: 12 %
Neutrophils Absolute: 2.5 x10E3/uL (ref 1.4–7.0)
Neutrophils: 42 %
Platelets: 273 x10E3/uL (ref 150–450)
RBC: 4.71 x10E6/uL (ref 3.77–5.28)
RDW: 12.2 % (ref 11.7–15.4)
WBC: 6.1 x10E3/uL (ref 3.4–10.8)

## 2024-10-30 LAB — LIPID PANEL
Chol/HDL Ratio: 4.3 ratio (ref 0.0–4.4)
Cholesterol, Total: 198 mg/dL (ref 100–199)
HDL: 46 mg/dL (ref >39)
LDL Chol Calc (NIH): 121 mg/dL — ABNORMAL HIGH (ref 0–99)
Triglycerides: 174 mg/dL — ABNORMAL HIGH (ref 0–149)
VLDL Cholesterol Cal: 31 mg/dL (ref 5–40)

## 2024-10-30 LAB — CMP14+EGFR
ALT: 23 IU/L (ref 0–32)
AST: 28 IU/L (ref 0–40)
Albumin: 4.2 g/dL (ref 3.8–4.8)
Alkaline Phosphatase: 77 IU/L (ref 49–135)
BUN/Creatinine Ratio: 18 (ref 12–28)
BUN: 14 mg/dL (ref 8–27)
Bilirubin Total: 0.6 mg/dL (ref 0.0–1.2)
CO2: 22 mmol/L (ref 20–29)
Calcium: 10 mg/dL (ref 8.7–10.3)
Chloride: 106 mmol/L (ref 96–106)
Creatinine, Ser: 0.76 mg/dL (ref 0.57–1.00)
Globulin, Total: 3 g/dL (ref 1.5–4.5)
Glucose: 97 mg/dL (ref 70–99)
Potassium: 4.4 mmol/L (ref 3.5–5.2)
Sodium: 141 mmol/L (ref 134–144)
Total Protein: 7.2 g/dL (ref 6.0–8.5)
eGFR: 82 mL/min/1.73 (ref >59)

## 2024-10-30 MED ORDER — LISINOPRIL 20 MG PO TABS: 20.0000 mg | ORAL_TABLET | Freq: Every day | ORAL | 3 refills | Status: AC

## 2024-10-30 NOTE — Progress Notes (Signed)
 BP 135/74   Pulse 68   Ht 5' 5 (1.651 m)   Wt 167 lb (75.8 kg)   SpO2 98%   BMI 27.79 kg/m    Subjective:   Patient ID: Teresa Patel, female    DOB: May 30, 1951, 73 y.o.   MRN: [id number]  HPI: Teresa Patel is a 73 y.o. female presenting on 10/30/2024 for Medical Management of Chronic Issues (CPE-no pap)   Discussed the use of AI scribe software for clinical note transcription with the patient, who gave verbal consent to proceed.  History of Present Illness   Teresa Patel is a 73 year old female who presents for an annual physical exam.  Chronic back pain - Chronic back pain attributed to arthritis - Pain worsens with physical exertion - Tylenol used for pain management - Weather changes exacerbate symptoms  Gastroesophageal reflux symptoms - Reflux symptoms occur a couple of times per week - Managed with over-the-counter medication as needed - Tomatoes and coffee identified as triggers - Intake of trigger foods is moderated  Nocturnal muscle cramps - Muscle cramps occur at night, especially after physical activity - Mustard consumed for symptom relief - Possible contribution from dehydration - Efforts made to maintain adequate hydration  Chronic disease management - Atorvastatin  taken for hyperlipidemia - Lisinopril  taken for hypertension - Calcium  supplement with vitamin D  and multivitamin taken over-the-counter          Relevant past medical, surgical, family and social history reviewed and updated as indicated. Interim medical history since our last visit reviewed. Allergies and medications reviewed and updated.  Review of Systems  Per HPI unless specifically indicated above   Allergies as of 10/30/2024   No Known Allergies      Medication List        Accurate as of October 30, 2024 10:17 AM. If you have any questions, ask your nurse or doctor.          atorvastatin  40 MG tablet Commonly known as: LIPITOR Take 40 mg by mouth daily.    CALCIUM -VITAMIN D  PO Take 120 mg by mouth daily.   Coenzyme Q10 100 MG Tabs Take 100 mg by mouth daily.   lisinopril  20 MG tablet Commonly known as: ZESTRIL  Take 1 tablet (20 mg total) by mouth daily.   multivitamin with minerals Tabs tablet Take 1 tablet by mouth daily.   PROBIOTIC-10 PO Take by mouth daily.         Objective:   BP 135/74   Pulse 68   Ht 5' 5 (1.651 m)   Wt 167 lb (75.8 kg)   SpO2 98%   BMI 27.79 kg/m   Wt Readings from Last 3 Encounters:  10/30/24 167 lb (75.8 kg)  09/17/24 167 lb (75.8 kg)  01/02/24 165 lb (74.8 kg)    Physical Exam Physical Exam   VITALS: BP- 135/74 HEENT: Pharynx clear, no erythema. CHEST: Lungs clear to auscultation bilaterally. CARDIOVASCULAR: Regular rate and rhythm, no murmurs. BREAST: No breast masses or tenderness. EXTREMITIES: No peripheral edema, pulses intact.         Assessment & Plan:   Problem List Items Addressed This Visit       Cardiovascular and Mediastinum   Hypertension   Relevant Medications   lisinopril  (ZESTRIL ) 20 MG tablet     Digestive   GERD (gastroesophageal reflux disease)   Relevant Orders   CBC with Differential/Platelet   CMP14+EGFR   Lipid panel     Other  Hyperlipidemia   Relevant Medications   lisinopril  (ZESTRIL ) 20 MG tablet   Other Relevant Orders   CBC with Differential/Platelet   CMP14+EGFR   Lipid panel   Other Visit Diagnoses       Physical exam    -  Primary   Relevant Orders   CBC with Differential/Platelet   CMP14+EGFR   Lipid panel     Essential hypertension       Relevant Medications   lisinopril  (ZESTRIL ) 20 MG tablet   Other Relevant Orders   CMP14+EGFR   Lipid panel           Adult Wellness Visit Routine wellness visit with no acute concerns. Blood pressure controlled. Mammogram scheduled for December. - Continue routine wellness visits. - Ensure mammogram is completed in December.  Essential hypertension Hypertension  well-controlled with lisinopril . Blood pressure 135/74 mmHg. - Continue lisinopril  for blood pressure management.  Mixed hyperlipidemia Managed with atorvastatin . No issues reported. - Continue atorvastatin  for cholesterol management. - Checked cholesterol levels today.  Chronic back pain due to osteoarthritis Chronic back pain managed with Tylenol. - Continue Tylenol for pain management.  Gastroesophageal reflux disease (GERD) GERD managed with over-the-counter medication. Symptoms influenced by dietary choices. - Continue over-the-counter medication as needed for GERD. - Advised moderation in dietary triggers such as tomatoes and tea.          Follow up plan: Return in about 1 year (around 10/30/2025), or if symptoms worsen or fail to improve, for Physical exam.  Counseling provided for all of the vaccine components Orders Placed This Encounter  Procedures   CBC with Differential/Platelet   CMP14+EGFR   Lipid panel    Fonda Levins, MD [facility] 10/30/2024, 10:17 AM

## 2024-11-03 DIAGNOSIS — Z780 Asymptomatic menopausal state: Secondary | ICD-10-CM | POA: Diagnosis not present

## 2024-11-06 ENCOUNTER — Ambulatory Visit: Payer: Self-pay | Admitting: Family Medicine

## 2024-11-18 ENCOUNTER — Telehealth: Payer: Self-pay

## 2024-11-18 NOTE — Progress Notes (Signed)
 The patient called back. She states that she has been taking only a half tablet each day instead of a whole tablet, which is prescribed. Since her most recent lab results showed elevated triglycerides, she has started taking a whole tablet per day. She reports having plenty of medication at home. Encouraged to continue taking her medication as prescribed and to call the clinic if she experiences any muscle aches or pains.   Woodie Jock, PharmD PGY1 Pharmacy Resident  11/18/2024

## 2024-11-18 NOTE — Progress Notes (Signed)
 Pharmacy Quality Measure Review  This patient is appearing on a report for being at risk of failing the adherence measure for cholesterol (statin) medications this calendar year.   Medication: atorvastatin  40 mg Last fill date: 07/23/24 for 90 day supply  Left voicemail for patient to return my call at their convenience. MyChart message sent to patient.   Woodie Jock, PharmD PGY1 Pharmacy Resident  11/18/2024

## 2024-12-05 ENCOUNTER — Other Ambulatory Visit: Payer: Self-pay | Admitting: Family Medicine

## 2024-12-05 DIAGNOSIS — Z1231 Encounter for screening mammogram for malignant neoplasm of breast: Secondary | ICD-10-CM

## 2024-12-08 ENCOUNTER — Inpatient Hospital Stay
Admission: RE | Admit: 2024-12-08 | Discharge: 2024-12-08 | Disposition: A | Source: Ambulatory Visit | Attending: Family Medicine

## 2024-12-08 DIAGNOSIS — Z1231 Encounter for screening mammogram for malignant neoplasm of breast: Secondary | ICD-10-CM

## 2025-01-02 ENCOUNTER — Ambulatory Visit: Payer: PPO

## 2025-01-02 ENCOUNTER — Telehealth: Payer: Self-pay

## 2025-01-02 VITALS — BP 135/74 | HR 68 | Ht 65.0 in | Wt 167.0 lb

## 2025-01-02 DIAGNOSIS — Z0000 Encounter for general adult medical examination without abnormal findings: Secondary | ICD-10-CM

## 2025-01-02 NOTE — Telephone Encounter (Signed)
 Copied from CRM [id number]. Topic: Appointments - Scheduling Inquiry for Clinic >> Jan 02, 2025 12:14 PM Graeme ORN wrote: Reason for CRM: Patient called. Her and her husband schedule for AWV starting at 80. Has not heard anything. Looks like someone called. States they did not get a call. Please call  back on house phone. Thank You

## 2025-01-02 NOTE — Patient Instructions (Signed)
 Teresa Patel,  Thank you for taking the time for your Medicare Wellness Visit. I appreciate your continued commitment to your health goals. Please review the care plan we discussed, and feel free to reach out if I can assist you further.  Please note that Annual Wellness Visits do not include a physical exam. Some assessments may be limited, especially if the visit was conducted virtually. If needed, we may recommend an in-person follow-up with your provider.  Ongoing Care Seeing your primary care provider every 3 to 6 months helps us  monitor your health and provide consistent, personalized care.   Referrals If a referral was made during today's visit and you haven't received any updates within two weeks, please contact the referred provider directly to check on the status.  Recommended Screenings:  Health Maintenance  Topic Date Due   COVID-19 Vaccine (3 - Moderna risk series) 01/04/2021   Medicare Annual Wellness Visit  01/01/2025   Zoster (Shingles) Vaccine (1 of 2) 01/30/2025*   Osteoporosis screening with Bone Density Scan  11/03/2026   Colon Cancer Screening  09/17/2029   DTaP/Tdap/Td vaccine (3 - Td or Tdap) 11/07/2029   Pneumococcal Vaccine for age over 49  Completed   Flu Shot  Completed   Hepatitis C Screening  Completed   Meningitis B Vaccine  Aged Out   Breast Cancer Screening  Discontinued  *Topic was postponed. The date shown is not the original due date.       01/02/2025   12:38 PM  Advanced Directives  Does Patient Have a Medical Advance Directive? No    Vision: Annual vision screenings are recommended for early detection of glaucoma, cataracts, and diabetic retinopathy. These exams can also reveal signs of chronic conditions such as diabetes and high blood pressure.  Dental: Annual dental screenings help detect early signs of oral cancer, gum disease, and other conditions linked to overall health, including heart disease and diabetes.  Please see the attached  documents for additional preventive care recommendations.

## 2025-01-02 NOTE — Progress Notes (Addendum)
 "  Chief Complaint  Patient presents with   Medicare Wellness     Subjective:   Teresa Patel is a 74 y.o. female who presents for a Medicare Annual Wellness Visit.  Visit info / Clinical Intake: Medicare Wellness Visit Type:: Subsequent Annual Wellness Visit Persons participating in visit and providing information:: patient Medicare Wellness Visit Mode:: Telephone If telephone:: video declined Since this visit was completed virtually, some vitals may be partially provided or unavailable. Missing vitals are due to the limitations of the virtual format.: Unable to obtain vitals - no equipment If Telephone or Video please confirm:: I connected with patient using audio/video enable telemedicine. I verified patient identity with two identifiers, discussed telehealth limitations, and patient agreed to proceed. Patient Location:: home Provider Location:: office Interpreter Needed?: No Pre-visit prep was completed: yes AWV questionnaire completed by patient prior to visit?: no Living arrangements:: lives with spouse/significant other Patient's Overall Health Status Rating: very good Typical amount of pain: none Does pain affect daily life?: no Are you currently prescribed opioids?: no  Dietary Habits and Nutritional Risks How many meals a day?: 3 Eats fruit and vegetables daily?: yes Most meals are obtained by: preparing own meals In the last 2 weeks, have you had any of the following?: none Diabetic:: no  Functional Status Activities of Daily Living (to include ambulation/medication): Independent Ambulation: Independent Medication Administration: Independent Home Management (perform basic housework or laundry): Independent Manage your own finances?: yes Primary transportation is: driving Concerns about vision?: no *vision screening is required for WTM* (last ov 2025) Concerns about hearing?: no  Fall Screening Falls in the past year?: 0 Number of falls in past year: 0 Was  there an injury with Fall?: 0 Fall Risk Category Calculator: 0 Patient Fall Risk Level: Low Fall Risk  Fall Risk Patient at Risk for Falls Due to: No Fall Risks Fall risk Follow up: Falls evaluation completed; Education provided  Home and Transportation Safety: All rugs have non-skid backing?: yes All stairs or steps have railings?: yes Grab bars in the bathtub or shower?: yes Have non-skid surface in bathtub or shower?: yes Good home lighting?: yes Regular seat belt use?: yes Hospital stays in the last year:: no  Cognitive Assessment Difficulty concentrating, remembering, or making decisions? : no Will 6CIT or Mini Cog be Completed: yes What year is it?: 0 points What month is it?: 0 points Give patient an address phrase to remember (5 components): [address] [place] About what time is it?: 0 points Count backwards from 20 to 1: 0 points Say the months of the year in reverse: 0 points Repeat the address phrase from earlier: 0 points 6 CIT Score: 0 points  Advance Directives (For Healthcare) Does Patient Have a Medical Advance Directive?: Yes Type of Advance Directive: Healthcare Power of [place]; Living will Copy of Healthcare Power of Attorney in Chart?: No - copy requested Copy of Living Will in Chart?: No - copy requested  Reviewed/Updated  Reviewed/Updated: Reviewed All (Medical, Surgical, Family, Medications, Allergies, Care Teams, Patient Goals); Medical History; Surgical History; Family History; Medications; Allergies; Care Teams; Patient Goals    Allergies (verified) Patient has no known allergies.   Current Medications (verified) Outpatient Encounter Medications as of 01/02/2025  Medication Sig   atorvastatin  (LIPITOR) 40 MG tablet Take 40 mg by mouth daily.   CALCIUM -VITAMIN D  PO Take 120 mg by mouth daily.   Coenzyme Q10 100 MG TABS Take 100 mg by mouth daily.   lisinopril  (ZESTRIL ) 20  MG tablet Take 1 tablet (20 mg total) by mouth daily.    Multiple Vitamin (MULTIVITAMIN WITH MINERALS) TABS tablet Take 1 tablet by mouth daily.   Probiotic Product (PROBIOTIC-10 PO) Take by mouth daily.   No facility-administered encounter medications on file as of 01/02/2025.    History: Past Medical History:  Diagnosis Date   Anxiety    GERD (gastroesophageal reflux disease)    Hyperlipidemia    Hypertension    Shingles    Past Surgical History:  Procedure Laterality Date   ABDOMINAL HYSTERECTOMY     carpal tunnel Bilateral    COLONOSCOPY N/A 03/21/2017   Procedure: COLONOSCOPY;  Surgeon: Claudis RAYMOND Rivet, MD;  Location: [facility];  Service: Endoscopy;  Laterality: N/A;  1030   COLONOSCOPY N/A 09/17/2024   Procedure: COLONOSCOPY;  Surgeon: Eartha Angelia Sieving, MD;  Location: [facility];  Service: Gastroenterology;  Laterality: N/A;  10:15am, asa 1-2   POLYPECTOMY  03/21/2017   Procedure: POLYPECTOMY;  Surgeon: Claudis RAYMOND Rivet, MD;  Location: [facility];  Service: Endoscopy;;  colon   Family History  Problem Relation Age of Onset   Heart disease Mother    Heart disease Father    Cancer Father        prostate   Stroke Sister    Melanoma Daughter 34   Cancer Maternal Grandmother        unknown   Stroke Paternal Grandfather    Cancer Brother        pancreatic   Heart disease Brother        heart attack   Heart disease Brother    Diabetes Brother    Kidney disease Brother    Cancer Brother        prostate   Colon cancer Neg Hx    Breast cancer Neg Hx    Social History   Occupational History   Occupation: Retired    Comment: Company Secretary  Tobacco Use   Smoking status: Never    Passive exposure: Yes   Smokeless tobacco: Never  Vaping Use   Vaping status: Never Used  Substance and Sexual Activity   Alcohol use: No   Drug use: No   Sexual activity: Yes   Tobacco Counseling Counseling given: Yes  SDOH Screenings   Food Insecurity: No Food Insecurity (01/02/2025)  Housing: Unknown (01/02/2025)   Transportation Needs: No Transportation Needs (01/02/2025)  Utilities: Not At Risk (01/02/2025)  Alcohol Screen: Low Risk (01/02/2024)  Depression (PHQ2-9): Low Risk (01/02/2025)  Financial Resource Strain: Low Risk (01/02/2024)  Physical Activity: Sufficiently Active (01/02/2025)  Social Connections: Socially Integrated (01/02/2025)  Stress: No Stress Concern Present (01/02/2025)  Tobacco Use: Medium Risk (01/02/2025)  Health Literacy: Adequate Health Literacy (01/02/2025)   See flowsheets for full screening details  Depression Screen PHQ 2 & 9 Depression Scale- Over the past 2 weeks, how often have you been bothered by any of the following problems? Little interest or pleasure in doing things: 0 Feeling down, depressed, or hopeless (PHQ Adolescent also includes...irritable): 0 PHQ-2 Total Score: 0 Trouble falling or staying asleep, or sleeping too much: 0 Feeling tired or having little energy: 0 Poor appetite or overeating (PHQ Adolescent also includes...weight loss): 0 Feeling bad about yourself - or that you are a failure or have let yourself or your family down: 0 Trouble concentrating on things, such as reading the newspaper or watching television (PHQ Adolescent also includes...like school work): 0 Moving or speaking so slowly that other people  could have noticed. Or the opposite - being so fidgety or restless that you have been moving around a lot more than usual: 0 Thoughts that you would be better off dead, or of hurting yourself in some way: 0 PHQ-9 Total Score: 0 If you checked off any problems, how difficult have these problems made it for you to do your work, take care of things at home, or get along with other people?: Not difficult at all     Goals Addressed             This Visit's Progress    DIET - INCREASE WATER  INTAKE   On track    Exercise 150 min/wk Moderate Activity   On track            Objective:    Today's Vitals   01/02/25 1236  BP: 135/74  Pulse:  68  Weight: 167 lb (75.8 kg)  Height: 5' 5 (1.651 m)   Body mass index is 27.79 kg/m.  Hearing/Vision screen No results found. Immunizations and Health Maintenance Health Maintenance  Topic Date Due   COVID-19 Vaccine (3 - Moderna risk series) 01/04/2021   Zoster Vaccines- Shingrix (1 of 2) 01/30/2025 (Originally 04/13/1970)   Medicare Annual Wellness (AWV)  01/02/2026   Bone Density Scan  11/03/2026   Colonoscopy  09/17/2029   DTaP/Tdap/Td (3 - Td or Tdap) 11/07/2029   Pneumococcal Vaccine: 50+ Years  Completed   Influenza Vaccine  Completed   Hepatitis C Screening  Completed   Meningococcal B Vaccine  Aged Out   Mammogram  Discontinued        Assessment/Plan:  This is a routine wellness examination for Teresa Patel.  Patient Care Team: Dettinger, Fonda LABOR, MD as PCP - General (Family Medicine) Golda Claudis PENNER, MD (Inactive) as Consulting Physician (Gastroenterology) Olegario Messier, MD as Referring Physician (Obstetrics and Gynecology) Dr Willma [facility], OD (Optometry)  I have personally reviewed and noted the following in the patients chart:   Medical and social history Use of alcohol, tobacco or illicit drugs  Current medications and supplements including opioid prescriptions. Functional ability and status Nutritional status Physical activity Advanced directives List of other physicians Hospitalizations, surgeries, and ER visits in previous 12 months Vitals Screenings to include cognitive, depression, and falls Referrals and appointments  No orders of the defined types were placed in this encounter.  In addition, I have reviewed and discussed with patient certain preventive protocols, quality metrics, and best practice recommendations. A written personalized care plan for preventive services as well as general preventive health recommendations were provided to patient.   Teresa Patel, CMA   01/02/2025   Return in 1 year (on 01/02/2026).  After  Visit Summary: (MyChart) Due to this being a telephonic visit, the after visit summary with patients personalized plan was offered to patient via MyChart   Nurse Notes: n/a "

## 2025-11-04 ENCOUNTER — Encounter: Admitting: Family Medicine

## 2026-01-05 ENCOUNTER — Ambulatory Visit
